# Patient Record
Sex: Female | Born: 1940 | Race: Black or African American | Hispanic: No | Smoking: Never smoker
Health system: Southern US, Community
[De-identification: ages and names within clinical notes are randomized; demographics above are authoritative.]

## PROBLEM LIST (undated history)

## (undated) DIAGNOSIS — M1990 Unspecified osteoarthritis, unspecified site: Secondary | ICD-10-CM

## (undated) DIAGNOSIS — I499 Cardiac arrhythmia, unspecified: Secondary | ICD-10-CM

## (undated) DIAGNOSIS — M7550 Bursitis of unspecified shoulder: Secondary | ICD-10-CM

## (undated) DIAGNOSIS — I10 Essential (primary) hypertension: Secondary | ICD-10-CM

## (undated) DIAGNOSIS — E7800 Pure hypercholesterolemia, unspecified: Secondary | ICD-10-CM

## (undated) DIAGNOSIS — I8390 Asymptomatic varicose veins of unspecified lower extremity: Secondary | ICD-10-CM

## (undated) DIAGNOSIS — H269 Unspecified cataract: Secondary | ICD-10-CM

## (undated) DIAGNOSIS — K2270 Barrett's esophagus without dysplasia: Secondary | ICD-10-CM

## (undated) DIAGNOSIS — Z9889 Other specified postprocedural states: Secondary | ICD-10-CM

## (undated) DIAGNOSIS — K219 Gastro-esophageal reflux disease without esophagitis: Secondary | ICD-10-CM

## (undated) HISTORY — PX: CHOLECYSTECTOMY: SHX55

## (undated) HISTORY — PX: COLONOSCOPY: SHX174

## (undated) HISTORY — PX: KNEE ARTHROSCOPY: SHX127

## (undated) HISTORY — PX: UPPER GI ENDOSCOPY: SHX6162

## (undated) HISTORY — PX: OTHER SURGICAL HISTORY: SHX169

## (undated) HISTORY — PX: ABDOMINAL HYSTERECTOMY: SHX81

## (undated) HISTORY — PX: FRACTURE SURGERY: SHX138

## (undated) HISTORY — PX: TONSILLECTOMY: SUR1361

---

## 2000-05-08 ENCOUNTER — Other Ambulatory Visit: Admission: RE | Admit: 2000-05-08 | Discharge: 2000-05-08 | Payer: Self-pay | Admitting: Gynecology

## 2000-05-11 ENCOUNTER — Encounter: Payer: Self-pay | Admitting: Gynecology

## 2000-05-11 ENCOUNTER — Encounter: Admission: RE | Admit: 2000-05-11 | Discharge: 2000-05-11 | Payer: Self-pay | Admitting: Gynecology

## 2000-11-25 ENCOUNTER — Encounter: Payer: Self-pay | Admitting: *Deleted

## 2000-11-25 ENCOUNTER — Encounter: Payer: Self-pay | Admitting: Orthopedic Surgery

## 2000-11-25 ENCOUNTER — Emergency Department (HOSPITAL_COMMUNITY): Admission: EM | Admit: 2000-11-25 | Discharge: 2000-11-25 | Payer: Self-pay | Admitting: *Deleted

## 2001-06-18 ENCOUNTER — Encounter: Payer: Self-pay | Admitting: Gynecology

## 2001-06-18 ENCOUNTER — Encounter: Admission: RE | Admit: 2001-06-18 | Discharge: 2001-06-18 | Payer: Self-pay | Admitting: Gynecology

## 2001-11-04 ENCOUNTER — Ambulatory Visit (HOSPITAL_BASED_OUTPATIENT_CLINIC_OR_DEPARTMENT_OTHER): Admission: RE | Admit: 2001-11-04 | Discharge: 2001-11-04 | Payer: Self-pay | Admitting: Orthopedic Surgery

## 2002-02-17 ENCOUNTER — Encounter: Admission: RE | Admit: 2002-02-17 | Discharge: 2002-02-17 | Payer: Self-pay | Admitting: Orthopedic Surgery

## 2002-02-17 ENCOUNTER — Encounter: Payer: Self-pay | Admitting: Orthopedic Surgery

## 2002-03-07 ENCOUNTER — Encounter: Payer: Self-pay | Admitting: Orthopedic Surgery

## 2002-03-07 ENCOUNTER — Encounter: Admission: RE | Admit: 2002-03-07 | Discharge: 2002-03-07 | Payer: Self-pay | Admitting: Orthopedic Surgery

## 2002-03-21 ENCOUNTER — Encounter: Admission: RE | Admit: 2002-03-21 | Discharge: 2002-03-21 | Payer: Self-pay | Admitting: Orthopedic Surgery

## 2002-03-21 ENCOUNTER — Encounter: Payer: Self-pay | Admitting: Orthopedic Surgery

## 2002-04-03 ENCOUNTER — Encounter: Payer: Self-pay | Admitting: Orthopedic Surgery

## 2002-04-03 ENCOUNTER — Encounter: Admission: RE | Admit: 2002-04-03 | Discharge: 2002-04-03 | Payer: Self-pay | Admitting: Orthopedic Surgery

## 2002-07-02 ENCOUNTER — Encounter: Admission: RE | Admit: 2002-07-02 | Discharge: 2002-07-02 | Payer: Self-pay | Admitting: Internal Medicine

## 2002-09-08 ENCOUNTER — Encounter (INDEPENDENT_AMBULATORY_CARE_PROVIDER_SITE_OTHER): Payer: Self-pay | Admitting: Specialist

## 2002-09-08 ENCOUNTER — Ambulatory Visit (HOSPITAL_COMMUNITY): Admission: RE | Admit: 2002-09-08 | Discharge: 2002-09-08 | Payer: Self-pay | Admitting: *Deleted

## 2002-10-28 ENCOUNTER — Other Ambulatory Visit: Admission: RE | Admit: 2002-10-28 | Discharge: 2002-10-28 | Payer: Self-pay | Admitting: Gynecology

## 2003-07-06 ENCOUNTER — Encounter: Admission: RE | Admit: 2003-07-06 | Discharge: 2003-07-06 | Payer: Self-pay | Admitting: Internal Medicine

## 2004-07-20 ENCOUNTER — Encounter: Admission: RE | Admit: 2004-07-20 | Discharge: 2004-07-20 | Payer: Self-pay | Admitting: Internal Medicine

## 2005-07-28 ENCOUNTER — Encounter: Admission: RE | Admit: 2005-07-28 | Discharge: 2005-07-28 | Payer: Self-pay | Admitting: Internal Medicine

## 2005-10-30 ENCOUNTER — Encounter (INDEPENDENT_AMBULATORY_CARE_PROVIDER_SITE_OTHER): Payer: Self-pay | Admitting: Specialist

## 2005-10-30 ENCOUNTER — Ambulatory Visit (HOSPITAL_COMMUNITY): Admission: RE | Admit: 2005-10-30 | Discharge: 2005-10-30 | Payer: Self-pay | Admitting: *Deleted

## 2006-08-03 ENCOUNTER — Encounter: Admission: RE | Admit: 2006-08-03 | Discharge: 2006-08-03 | Payer: Self-pay | Admitting: Internal Medicine

## 2007-01-04 ENCOUNTER — Encounter (INDEPENDENT_AMBULATORY_CARE_PROVIDER_SITE_OTHER): Payer: Self-pay | Admitting: *Deleted

## 2007-01-04 ENCOUNTER — Ambulatory Visit (HOSPITAL_COMMUNITY): Admission: RE | Admit: 2007-01-04 | Discharge: 2007-01-04 | Payer: Self-pay | Admitting: *Deleted

## 2007-08-07 ENCOUNTER — Encounter: Admission: RE | Admit: 2007-08-07 | Discharge: 2007-08-07 | Payer: Self-pay | Admitting: Internal Medicine

## 2008-08-24 ENCOUNTER — Encounter: Admission: RE | Admit: 2008-08-24 | Discharge: 2008-08-24 | Payer: Self-pay | Admitting: Internal Medicine

## 2008-10-03 ENCOUNTER — Emergency Department (HOSPITAL_BASED_OUTPATIENT_CLINIC_OR_DEPARTMENT_OTHER): Admission: EM | Admit: 2008-10-03 | Discharge: 2008-10-03 | Payer: Self-pay | Admitting: Emergency Medicine

## 2008-10-03 ENCOUNTER — Ambulatory Visit: Payer: Self-pay | Admitting: Interventional Radiology

## 2009-07-08 ENCOUNTER — Encounter: Admission: RE | Admit: 2009-07-08 | Discharge: 2009-07-08 | Payer: Self-pay | Admitting: Internal Medicine

## 2009-08-26 ENCOUNTER — Encounter: Admission: RE | Admit: 2009-08-26 | Discharge: 2009-08-26 | Payer: Self-pay | Admitting: Internal Medicine

## 2010-04-15 ENCOUNTER — Encounter: Admission: RE | Admit: 2010-04-15 | Discharge: 2010-04-15 | Payer: Self-pay | Admitting: Internal Medicine

## 2010-08-28 ENCOUNTER — Encounter: Payer: Self-pay | Admitting: Internal Medicine

## 2010-09-01 ENCOUNTER — Encounter
Admission: RE | Admit: 2010-09-01 | Discharge: 2010-09-01 | Payer: Self-pay | Source: Home / Self Care | Attending: Internal Medicine | Admitting: Internal Medicine

## 2010-12-20 NOTE — Op Note (Signed)
NAMEAMA, MCMASTER NO.:  [id number]   MEDICAL RECORD NO.:  [id number]          PATIENT TYPE:  AMB   LOCATION:  ENDO                         FACILITY:  [facility]   PHYSICIAN:  Georgiana Spinner, M.D.    DATE OF BIRTH:  06-27-1941   DATE OF PROCEDURE:  01/04/2007  DATE OF DISCHARGE:                               OPERATIVE REPORT   PROCEDURE:  Upper endoscopy.   INDICATIONS:  Gastroesophageal reflux disease.  Rule out Barrett  esophagus.   ANESTHESIA:  Fentanyl 75 mcg, Versed 5 mg.   PROCEDURE:  With the patient mildly sedated in the left lateral  decubitus position, the Pentax videoscopic endoscope was passed under  direct vision through the esophagus and I could not get the  squamocolumnar junction to open fully but I elected to biopsy areas that  could be potential bleed Barrett's.  After photographing the area we  entered into the stomach, fundus, body, antrum, duodenal bulb, second  portion of the duodenum were visualized.  From this point the endoscope  was slowly withdrawn, taking circumferential views of duodenal mucosa  until the endoscope which had been pulled back into the stomach and  placed in retroflexion to view the stomach from below.  The endoscope  was straightened and withdrawn, taking circumferential views of  remaining gastric and esophageal mucosa stopping in the body of the  stomach where a small, benign-appearing polyp was seen, photographed and  biopsied.  The patient's vital signs, pulse oximeter remained stable.  The patient tolerated the procedure well without apparent complications.   FINDINGS:  Polyp in the body of the stomach and a question of Barrett  esophagus, both biopsied.  Await biopsy reports.  The patient will call  me for results and followup with me as an outpatient.           ______________________________  Georgiana Spinner, M.D.     GMO/MEDQ  D:  01/04/2007  T:  01/04/2007  Job:  [id number]

## 2010-12-23 NOTE — Op Note (Signed)
NAMELEONETTE, TISCHER NO.:  [id number]   MEDICAL RECORD NO.:  [id number]          PATIENT TYPE:  AMB   LOCATION:  ENDO                         FACILITY:  [facility]   PHYSICIAN:  Georgiana Spinner, M.D.    DATE OF BIRTH:  [birth year]   DATE OF PROCEDURE:  10/30/2005  DATE OF DISCHARGE:                                 OPERATIVE REPORT   PROCEDURE:  Colonoscopy.   INDICATIONS:  Colon polyps.   ANESTHESIA:  Demerol 10, Versed 2 mg.   PROCEDURE:  The patient mildly sedated in the left lateral decubitus  position, the Olympus videoscopic colonoscope was inserted into the rectum  and passed under direct vision to the cecum identified by base of cecum and  ileocecal valve, both of which were photographed.  From this point the  colonoscope was slowly withdrawn taking circumferential views of colonic  mucosa stopping only in the rectum which appeared normal on direct and  showed hemorrhoids on retroflexed view.  The endoscope was straightened and  withdrawn.  The patient's vital signs, pulse oximeter remained stable.  The  patient tolerated procedure well without complications.   FINDINGS:  Internal hemorrhoids otherwise unremarkable colonoscopic  examination to the cecum.   PLAN:  Consider repeat examination possibly in 5 years           ______________________________  Georgiana Spinner, M.D.     GMO/MEDQ  D:  10/30/2005  T:  10/31/2005  Job:  [id number]

## 2010-12-23 NOTE — Op Note (Signed)
Nancy Sosa, LADLEY NO.:  [id number]   MEDICAL RECORD NO.:  [id number]          PATIENT TYPE:  AMB   LOCATION:  ENDO                         FACILITY:  [facility]   PHYSICIAN:  Georgiana Spinner, M.D.    DATE OF BIRTH:  [birth year]   DATE OF PROCEDURE:  10/30/2005  DATE OF DISCHARGE:                                 OPERATIVE REPORT   PROCEDURE:  Upper endoscopy.   INDICATIONS:  Barrett's esophagus, GERD.   ANESTHESIA:  Demerol 70 mg, Versed 5 mg.   PROCEDURE:  The patient mildly sedated in the left lateral decubitus  position the Olympus videoscopic endoscope was inserted in the mouth and  passed under direct vision through the esophagus which appeared normal until  we reached distal esophagus and there appeared to be an area of Barrett's  photographed and biopsied.  We entered into the stomach. Fundus appeared  normal, body showed a polyp on the posterior wall.  It was approximately 7  mm in size, it was smooth and benign appearing and was biopsied.  We then  advanced through the pylorus into duodenal bulb, second portion duodenum  both of which appeared normal.  From this point the endoscope was slowly  withdrawn taking circumferential views of duodenal mucosa until the  endoscope had been pulled back into the stomach placed in retroflexion to  view the stomach from below.  The endoscope was straightened and withdrawn  taking circumferential views remaining gastric and esophageal mucosa.  The  patient's vital signs, pulse oximeter remained stable.  The patient  tolerated procedure well without apparent complications.   FINDINGS:  Changes of Barrett's esophagus, polyp of the body of the stomach.  Await biopsy reports.  The patient will call me for results and follow-up  with me as an outpatient.  Proceed to colonoscopy as planned.           ______________________________  Georgiana Spinner, M.D.     GMO/MEDQ  D:  10/30/2005  T:  10/31/2005  Job:  [id number]

## 2010-12-23 NOTE — Op Note (Signed)
   NAME:  Nancy Sosa, Nancy Sosa                           ACCOUNT NO.:  [id number]   MEDICAL RECORD NO.:  [id number]                   PATIENT TYPE:  AMB   LOCATION:  ENDO                                 FACILITY:  [facility]   PHYSICIAN:  Georgiana Spinner, M.D.                 DATE OF BIRTH:  08-Oct-1940   DATE OF PROCEDURE:  09/08/2002  DATE OF DISCHARGE:                                 OPERATIVE REPORT   PROCEDURE PERFORMED:  Colonoscopy with polypectomy.   ENDOSCOPIST:  Georgiana Spinner, M.D.   INDICATIONS FOR PROCEDURE:  Colon polyps, rectal bleeding.   ANESTHESIA:  Demerol 10 mg, Versed 1 mg.   DESCRIPTION OF PROCEDURE:  With the patient mildly sedated in the left  lateral decubitus position, the Olympus video colonoscope was inserted in  the rectum and passed easily under direct vision to the cecum, identified by  the ileocecal valve and appendiceal orifice, both of which were  photographed.  From this point the colonoscope was slowly withdrawn taking  circumferential views of the entire colonic mucosa stopping in the sigmoid  colon at approximately 30 cm from the anal verge at which point a small  polyp was seen, photographed and removed using snare cautery technique at a  setting of 20/20 blended current.  The endoscope was then withdrawn to the  rectum which appeared normal on direct and showed hemorrhoids on retroflex  view.  The endoscope was straightened and withdrawn.  The patient's vital  signs and pulse oximeter remained stable.  The patient tolerated the  procedure well without apparent complications.   FINDINGS:  Diverticulosis of the sigmoid colon, small polyp at 30 cm from  the anal verge, await biopsy report.  Patient will call me for results and  follow up with me as an outpatient.   PLAN:                                                 Georgiana Spinner, M.D.    GMO/MEDQ  D:  09/08/2002  T:  09/08/2002  Job:  [id number]

## 2010-12-23 NOTE — Op Note (Signed)
   NAME:  Nancy Sosa, Nancy Sosa                           ACCOUNT NO.:  [id number]   MEDICAL RECORD NO.:  [id number]                   PATIENT TYPE:  AMB   LOCATION:  ENDO                                 [facility]ACILITY[facility]  [facility]   PHYSICIAN:  Georgiana Spinner, M.D.                 DATE OF BIRTH:  [birth year]   DATE OF PROCEDURE:  09/08/2002  DATE OF DISCHARGE:                                 OPERATIVE REPORT   PREOPERATIVE DIAGNOSIS:  Upper endoscopy.   INDICATIONS:  GI bleed.   ANESTHESIA:  Demerol 50 mg, Versed 6 mg.   DESCRIPTION OF PROCEDURE:  With the patient mildly sedated in the left  lateral decubitus position, the Olympus videoscopic endoscope was inserted  in the mouth, passed under direct vision through the esophagus, which  appeared normal; however, there was a questionable area of Barrett's  photographed, and we attempted to biopsy this area.  The fundus, body,  antrum, duodenal bulb, and second portion of the duodenum appeared normal.  From this point the endoscope was slowly withdrawn, taking circumferential  views of the entire duodenal mucosa until the endoscope had been pulled back  into the stomach, placed in retroflexion to view the stomach from below.  The endoscope was straightened and withdrawn, taking circumferential views  of the remaining gastric and esophageal mucosa.  The patient's vital signs  and pulse oximetry remained stable.  The patient tolerated the procedure  well without apparent complications.   FINDINGS:  Question of short-segment Barrett's esophagus, biopsied.   PLAN:  Await biopsy results.  The patient will follow up with me as an  outpatient and call me for the results of biopsy.                                               Georgiana Spinner, M.D.    GMO/MEDQ  D:  09/08/2002  T:  09/08/2002  Job:  [id number]

## 2011-05-31 ENCOUNTER — Other Ambulatory Visit (HOSPITAL_COMMUNITY): Payer: Self-pay | Admitting: Orthopedic Surgery

## 2011-05-31 ENCOUNTER — Other Ambulatory Visit: Payer: Self-pay | Admitting: Orthopedic Surgery

## 2011-05-31 ENCOUNTER — Encounter (HOSPITAL_COMMUNITY): Payer: Medicare Other

## 2011-05-31 ENCOUNTER — Ambulatory Visit (HOSPITAL_COMMUNITY)
Admission: RE | Admit: 2011-05-31 | Discharge: 2011-05-31 | Disposition: A | Discharge status: Home / Self Care | Payer: Medicare Other | Source: Ambulatory Visit | Attending: Orthopedic Surgery | Admitting: Orthopedic Surgery

## 2011-05-31 DIAGNOSIS — Z01818 Encounter for other preprocedural examination: Secondary | ICD-10-CM | POA: Insufficient documentation

## 2011-05-31 DIAGNOSIS — M1711 Unilateral primary osteoarthritis, right knee: Secondary | ICD-10-CM

## 2011-05-31 DIAGNOSIS — I10 Essential (primary) hypertension: Secondary | ICD-10-CM | POA: Insufficient documentation

## 2011-05-31 DIAGNOSIS — Z8709 Personal history of other diseases of the respiratory system: Secondary | ICD-10-CM | POA: Insufficient documentation

## 2011-05-31 DIAGNOSIS — Z01812 Encounter for preprocedural laboratory examination: Secondary | ICD-10-CM | POA: Insufficient documentation

## 2011-05-31 DIAGNOSIS — M1710 Unilateral primary osteoarthritis, unspecified knee: Secondary | ICD-10-CM | POA: Insufficient documentation

## 2011-05-31 DIAGNOSIS — Z01811 Encounter for preprocedural respiratory examination: Secondary | ICD-10-CM | POA: Insufficient documentation

## 2011-05-31 LAB — URINALYSIS, ROUTINE W REFLEX MICROSCOPIC
Urobilinogen, UA: 0.2 mg/dL (ref 0.0–1.0)
pH: 6 (ref 5.0–8.0)

## 2011-05-31 LAB — CBC
MCH: 30.1 pg (ref 26.0–34.0)
MCV: 90.4 fL (ref 78.0–100.0)

## 2011-05-31 LAB — APTT: aPTT: 33 seconds (ref 24–37)

## 2011-05-31 LAB — URINE MICROSCOPIC-ADD ON

## 2011-06-05 ENCOUNTER — Inpatient Hospital Stay (HOSPITAL_COMMUNITY)
Admission: RE | Admit: 2011-06-05 | Discharge: 2011-06-08 | DRG: 470 | Disposition: A | Discharge status: Skilled Nursing Facility | Payer: Medicare Other | Source: Ambulatory Visit | Attending: Orthopedic Surgery | Admitting: Orthopedic Surgery

## 2011-06-05 DIAGNOSIS — H547 Unspecified visual loss: Secondary | ICD-10-CM | POA: Diagnosis present

## 2011-06-05 DIAGNOSIS — E7800 Pure hypercholesterolemia, unspecified: Secondary | ICD-10-CM | POA: Diagnosis present

## 2011-06-05 DIAGNOSIS — R000 Tachycardia, unspecified: Secondary | ICD-10-CM | POA: Diagnosis present

## 2011-06-05 DIAGNOSIS — K219 Gastro-esophageal reflux disease without esophagitis: Secondary | ICD-10-CM | POA: Diagnosis present

## 2011-06-05 DIAGNOSIS — E876 Hypokalemia: Secondary | ICD-10-CM | POA: Diagnosis not present

## 2011-06-05 DIAGNOSIS — I10 Essential (primary) hypertension: Secondary | ICD-10-CM | POA: Diagnosis present

## 2011-06-05 DIAGNOSIS — H269 Unspecified cataract: Secondary | ICD-10-CM | POA: Diagnosis present

## 2011-06-05 DIAGNOSIS — Z01812 Encounter for preprocedural laboratory examination: Secondary | ICD-10-CM

## 2011-06-05 DIAGNOSIS — J309 Allergic rhinitis, unspecified: Secondary | ICD-10-CM | POA: Diagnosis present

## 2011-06-05 DIAGNOSIS — M1710 Unilateral primary osteoarthritis, unspecified knee: Principal | ICD-10-CM | POA: Diagnosis present

## 2011-06-05 DIAGNOSIS — Z880 Allergy status to penicillin: Secondary | ICD-10-CM

## 2011-06-05 DIAGNOSIS — I8390 Asymptomatic varicose veins of unspecified lower extremity: Secondary | ICD-10-CM | POA: Diagnosis present

## 2011-06-05 DIAGNOSIS — Z79899 Other long term (current) drug therapy: Secondary | ICD-10-CM

## 2011-06-05 DIAGNOSIS — J45909 Unspecified asthma, uncomplicated: Secondary | ICD-10-CM | POA: Diagnosis present

## 2011-06-05 DIAGNOSIS — K2270 Barrett's esophagus without dysplasia: Secondary | ICD-10-CM | POA: Diagnosis present

## 2011-06-05 DIAGNOSIS — E871 Hypo-osmolality and hyponatremia: Secondary | ICD-10-CM | POA: Diagnosis not present

## 2011-06-05 HISTORY — PX: JOINT REPLACEMENT: SHX530

## 2011-06-05 LAB — TYPE AND SCREEN: Antibody Screen: NEGATIVE

## 2011-06-05 LAB — ABO/RH: ABO/RH(D): A POS

## 2011-06-06 LAB — CBC
HCT: 36.5 % (ref 36.0–46.0)
Hemoglobin: 12.2 g/dL (ref 12.0–15.0)
MCV: 89 fL (ref 78.0–100.0)
Platelets: 240 10*3/uL (ref 150–400)
RBC: 4.1 MIL/uL (ref 3.87–5.11)
RDW: 13.1 % (ref 11.5–15.5)

## 2011-06-06 LAB — BASIC METABOLIC PANEL
BUN: 17 mg/dL (ref 6–23)
Calcium: 9.5 mg/dL (ref 8.4–10.5)
Chloride: 96 mEq/L (ref 96–112)
Creatinine, Ser: 0.71 mg/dL (ref 0.50–1.10)
GFR calc non Af Amer: 85 mL/min — ABNORMAL LOW (ref >90)
Glucose, Bld: 121 mg/dL — ABNORMAL HIGH (ref 70–99)
Potassium: 3.8 mEq/L (ref 3.5–5.1)
Sodium: 132 mEq/L — ABNORMAL LOW (ref 135–145)

## 2011-06-07 LAB — BASIC METABOLIC PANEL
Calcium: 9.9 mg/dL (ref 8.4–10.5)
Glucose, Bld: 118 mg/dL — ABNORMAL HIGH (ref 70–99)
Potassium: 3.3 mEq/L — ABNORMAL LOW (ref 3.5–5.1)
Sodium: 136 mEq/L (ref 135–145)

## 2011-06-07 LAB — CBC
HCT: 32.7 % — ABNORMAL LOW (ref 36.0–46.0)
Hemoglobin: 10.9 g/dL — ABNORMAL LOW (ref 12.0–15.0)
MCH: 29.7 pg (ref 26.0–34.0)
MCV: 89.1 fL (ref 78.0–100.0)
RBC: 3.67 MIL/uL — ABNORMAL LOW (ref 3.87–5.11)
RDW: 12.9 % (ref 11.5–15.5)

## 2011-06-08 LAB — CBC: Platelets: 212 10*3/uL (ref 150–400)

## 2011-06-09 NOTE — H&P (Signed)
Nancy Sosa, Nancy Sosa                 ACCOUNT NO.:  [id number]  MEDICAL RECORD NO.:  [id number]  LOCATION:                                 FACILITY:  PHYSICIAN:  Ollen Gross, M.D.    DATE OF BIRTH:  [birth year]  DATE OF ADMISSION:  06/05/2011 DATE OF DISCHARGE:                             HISTORY & PHYSICAL   CHIEF COMPLAINT:  Right knee pain.  HISTORY OF PRESENT ILLNESS:  The patient is a 70 year old female who has been seen by Dr. Despina Hick for ongoing bilateral knee pain.  The right is more symptomatic and problematic than the left.  It has been going on for over 10 years now.  Over the past 5 years though, she has been getting progressively worse.  She has had an arthroscopic debridement. She also has undergone cortisone shots and viscous supplementation shots.  Despite conservative measures including therapy, cortisone shots and gel shots, she continues to have continued pain.  She has noted already to have end-stage arthritis of the right knee with advanced arthritis of the left knee.  It is felt she would benefit from undergoing surgical intervention.  Risks and benefits have been discussed.  She elected to proceed with surgery.  No obvious contraindications to surgery such as ongoing infection or progressive neurological disease.  ALLERGIES: 1. STATIN DRUGS. 2. PENICILLIN. 3. SEPTRA.  CURRENT MEDICATIONS:  Allegra, Benicar HCTZ, Estroven, Zetia, tramadol, calcium plus D, multivitamin, meloxicam, magnesium, metoprolol and Nexium.  PAST MEDICAL HISTORY: 1. Occasional headaches. 2. Impaired vision, wears glasses. 3. Seasonal allergies. 4. History of asthma. 5. History of bronchitis. 6. Hypertension. 7. Reflux disease. 8. Barrett esophagitis. 9. Hypercholesterolemia. 10.History of heart arrhythmia/tachycardia. 11.Early cataracts. 12.Varicose veins.  PAST SURGICAL HISTORY: 1. Tonsillectomy. 2. Hysterectomy. 3. 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 4. Arthroscopic knee surgery bilaterally. 5. Left foot bunion. 6. Gallbladder surgery.  FAMILY HISTORY:  Noncontributory.  SOCIAL HISTORY:  Widowed.  Retired.  Nonsmoker, no alcohol.  Lives alone.  She does want to look into a skilled facility, such as [facility].  REVIEW OF SYSTEMS:  GENERAL:  No fevers or chills.  Occasional night sweats.  NEUROLOGIC:  Occasional headaches.  No seizure, syncope or paralysis.  RESPIRATORY:  A little bit of shortness of breath on exertion, but no shortness of breath, productive cough or hemoptysis. CARDIOVASCULAR:  No chest pain, angina or orthopnea.  She does have a history of tachycardia.  GI:  No nausea, vomiting, diarrhea or constipation.  Occasional heartburn.  GU:  No dysuria or hematuria. Does have a little bit of frequency and nocturia.  MUSCULOSKELETAL: Knee pain.  PHYSICAL EXAMINATION:  VITAL SIGNS:  Pulse 68, respirations 14, blood pressure 132/82. GENERAL:  This is a 70 year old African American female, well nourished, well developed, overweight, in no acute distress.  She is alert, oriented, cooperative and pleasant.  HEENT:  Normocephalic and atraumatic.  Pupils are round, reactive.  EOMs intact. NECK:  Supple.  No carotid bruits. CHEST:  Clear anterior posterior chest walls.  No rhonchi, rales or wheezing. HEART:  Regular rate and rhythm.  No murmur.  S1, S2 noted. ABDOMEN:  Soft, round, protuberant abdomen.  Bowel sounds present. RECTAL:  Not done, not pertinent to present illness. BREASTS:  Not done, not pertinent to present illness. GENITALIA:  Not done, not pertinent to present illness. EXTREMITIES:  Right knee varus malalignment deformity, crepitus, range of motion 5-95.  IMPRESSION:  Osteoarthritis, right knee.  PLAN:  The patient admitted to [facility] to undergo a right total knee replacement arthroplasty.  Surgery will be performed by Dr. Trudee Grip.  She does want to look into [facility] for  inpatient rehab following her hospital stay.     Alexzandrew L. Julien Girt, P.A.C.   ______________________________ Ollen Gross, M.D.   ALP/MEDQ  D:  06/04/2011  T:  06/04/2011  Job:  [id number]  cc:   Juline Patch, M.D. Fax: [phone]  Electronically Signed by Patrica Duel P.A.C. on 06/08/2011 10:30:55 AM Electronically Signed by Ollen Gross M.D. on 06/09/2011 09:50:21 AM

## 2011-06-09 NOTE — Discharge Summary (Signed)
NAMEHARVEY, Nancy Sosa NO.:  [id number]  MEDICAL RECORD NO.:  [id number]  LOCATION:  1610                         FACILITY:  [facility]  PHYSICIAN:  Ollen Gross, M.D.    DATE OF BIRTH:  1941-06-18  DATE OF ADMISSION:  06/05/2011 DATE OF DISCHARGE:  06/08/2011                        DISCHARGE SUMMARY - REFERRING   ADMITTING DIAGNOSES: 1. Osteoarthritis, right knee. 2. Occasional headaches. 3. Impaired vision. 4. Seasonal allergies. 5. History of asthma. 6. History of bronchitis. 7. Hypertension. 8. Reflux disease. 9. Barrett esophagitis. 10.Hypercholesterolemia. 11.History of heart arrhythmia/tachycardia. 12.Early cataracts. 13.Varicose veins.  DISCHARGE DIAGNOSES: 1. Osteoarthritis of right knee, status post right total knee     replacement arthroplasty. 2. Postop hyponatremia. 3. Mild postop hypokalemia. 4. Occasional headaches. 5. Impaired vision. 6. Seasonal allergies. 7. History of asthma. 8. History of bronchitis. 9. Hypertension. 10.Reflux disease. 11.Barrett esophagitis. 12.Hypercholesterolemia. 13.History of heart arrhythmia/tachycardia. 14.Early cataracts. 15.Varicose veins.  PROCEDURE:  On June 05, 2011, right total knee arthroplasty. Surgeon:  Ollen Gross, M.D.  Assistant:  Alexzandrew L. Perkins, P.A.C.  Anesthesia:  General.  Tourniquet time: 37 minutes.  CONSULTS:  None.  BRIEF HISTORY:  The patient is a 70 year old female with advanced arthritis of the right knee, progressive worsening pain, increasing dysfunction.  She already has severe bone-on-bone changes tricompartmentally and also 20-degree flexion contracture.  It was felt she would benefit undergoing surgical intervention.  Risks and has been discussed, she elected to surgery.  LABORATORY DATA:  CBC on admission:  Hemoglobin 13.8, hematocrit 41.4, white cell count 5.5, platelets 230, PT/INR 13.7 and 1.03 with a PTT of 30.  Urinalysis preop, cloudy.  Trace blood,  moderate leukocytes, many squamous, 7-10 white cells, 0-2 red cells, few yeast.  Chem panel was taken over to medical physicians' office on May 24, 2011, slightly elevated BUN at 27.  Remaining chem panel within normal limits.  She also had a lipid profile taken preoperatively.  Cholesterol elevated to 261, triglycerides 110, HDL 55, LDL slightly elevated at 184, VLDL 22. Thyroid level of 1.880 and a creatine kinase elevated at 597.  Serial CBCs were followed.  Hemoglobin dropped down to 12.2, then 10.9, came back up.  Last H and H 11.2 and 33.3.  Serial BMETs were followed. Sodium dropped down to 132, back up to 136; potassium dropped to 3.8, last noted 3.3.  Blood group type A positive.  Nasal swabs were negative.  Staph aureus negative for MRSA.  X-RAYS:  May 31, 2011, two-view chest, stable exam, no active cardiopulmonary process.  EKG dated March 23, 2011, sinus bradycardia, poor R-wave progression, probable normal variant, inferior T-wave changes were nonspecific. Confirmed unable to read signature.  HOSPITAL COURSE:  The patient admitted to [facility], taken to OR, underwent above-stated procedure without complication.  Due to her history, she was again noted to be a little tachycardic after surgery. She was transferred to the telemetry floor for monitoring overnight. She did well through the night.  She was placed back on her home meds including her beta-blocker, metoprolol.  Her pulse rate in the next morning was in the 80.  She had a rough night with pain, but doing better on  the morning of day 1, therefore she was transferred up to the orthopedic floor for continued care.  Allowed to be weightbearing as tolerated, getting up out of bed with PT and also undergone OT.  She knew she want to look into a skilled facility, so we got social worker involved right away.  She wanted look into [facility].  By day 2, she had been weaned off her IV analgesics in the  form of the PCA and weaned over to p.o. meds.  By day 2, she was doing much better.  Pain was under better control.  Dressing changed, incision looked good.  Hemoglobin was 10.9.  At that point, potassium just a little low at 3.3, felt to be a dilutional component.  We are going to allow her to constrict backup. Continued progress with therapy.  By the morning of day 3, she was doing well.  Her incision looked good.  Hemoglobin was stable 11.2, no complaints.  It is felt that as long as a bed was available, she transferred to [facility].  DISCHARGE PLAN: 1. Plan to transfer to [facility] today on June 08, 2011. 2. Discharge diagnoses, please see above. 3. Discharge meds.  CURRENT MEDICATIONS:  At time of transfer include; 1. Xarelto 10 mg p.o. daily for 3 weeks, then discontinue the Xarelto. 2. Colace 100 mg p.o. b.i.d. 3. Metoprolol 100 mg daily at bedtime. 4. Benicar/HCTZ 40/12.5 mg p.o. every morning. 5. Zetia 10 mg p.o. every evening. 6. Nexium 40 mg p.o. at bedtime. 7. Tylenol 325 one or two every 4-6 hours as needed for mild pain,     temperature, or headache. 8. Laxative of choice. 9. Enema of choice. 10.Robaxin 500 mg p.o. q.6-8 hours p.r.n. spasm. 11.Tramadol 50 mg p.o. twice a day as needed for mild-to-moderate     pain. 12.OxyIR 5 mg 1-2 tabs every 4 hours as needed for moderate pain. 13.Claritin 10 mg p.o. q. day p.r.n. seasonal allergies.  DIET:  Heart healthy cardiac diet.  ACTIVITY:  She is weightbearing as tolerated to the right lower extremity.  Continue with PT and OT for gait training, ambulation, ADLs, range of motion strengthening exercises, total knee protocol.  Please note, she may start showering, however, do not submerge incision under water.  FOLLOWUP:  She needs to follow up Dr. Lequita Halt in the office 2 weeks from the date of surgery.  Please contact the office at [phone] to help arrange appointment, follow-up care.  DISPOSITION:  [facility].  CONDITION ON DISCHARGE:  Improving.     Alexzandrew L. Julien Girt, P.A.C.   ______________________________ Ollen Gross, M.D.    ALP/MEDQ  D:  06/08/2011  T:  06/08/2011  Job:  [id number]  cc:   Juline Patch, M.D. Fax: [phone]  Skilled Nursing Facility  Electronically Signed by Patrica Duel P.A.C. on 06/08/2011 11:51:27 AM Electronically Signed by Ollen Gross M.D. on 06/09/2011 09:50:23 AM

## 2011-06-09 NOTE — Op Note (Signed)
NAMEARIELIZ, LATINO NO.:  [id number]  MEDICAL RECORD NO.:  [id number]  LOCATION:  0005                         FACILITY:  [facility]  PHYSICIAN:  Ollen Gross, M.D.    DATE OF BIRTH:  1941-01-13  DATE OF PROCEDURE:  06/05/2011 DATE OF DISCHARGE:                              OPERATIVE REPORT   PREOPERATIVE DIAGNOSIS:  Osteoarthritis, right knee.  POSTOPERATIVE DIAGNOSIS:  Osteoarthritis, right knee.  PROCEDURE:  Right total knee arthroplasty.  SURGEON:  Ollen Gross, M.D.  ASSISTANT:  Alexzandrew L. Perkins, P.A.C.  ANESTHESIA:  General.  ESTIMATED BLOOD LOSS:  Minimal.  DRAINS:  Hemovac x1.  TOURNIQUET TIME:  37 minutes at 300 mmHg.  COMPLICATIONS:  None.  CONDITION:  Stable to recovery.  BRIEF CLINICAL NOTE:  Nancy Sosa is a 70 year old female with advanced end-stage arthritis of the right knee with progressively worsening pain and dysfunction.  She has severe bone-on-bone changes tricompartmental in nature with a 20 degree flexion contracture.  She has pain with activity as well as at rest.  She cannot sleep at night.  She had difficulty with all ADLs.  She cannot exercise and physical therapy is not an option given her pain and deformity.  She has had analgesics without benefit.  She has had injections of cortisone without benefit. At this point given her severe end-stage arthritis with severe dysfunction, it is felt that total knee arthroplasty is the most reasonable means of improving her pain and dysfunction.  Of note, on her x-rays, she has tricompartmental bone-on-bone with severe subchondral cystic formation, tibial subluxation, and marginal osteophytes.  PROCEDURE IN DETAIL:  After successful administration of general anesthetic, a tourniquet was placed high on the right thigh and right lower extremity was prepped and draped in the usual sterile fashion. Extremity was wrapped in Esmarch, knee flexed, tourniquet inflated to 300 mmHg.   Midline incision was made with a 10 blade through subcutaneous tissue to the level of the extensor mechanism.  A fresh blade was used to make a medial parapatellar arthrotomy.  Soft tissue on the proximal medial tibia subperiosteally elevated to the joint line with the knife into the semimembranosus bursa with a Cobb elevator. Soft tissue laterally was elevated with attention being paid to avoiding the patellar tendon on tibial tubercle.  Patella was everted, knee flexed 90 degrees, and ACL and PCL removed.  Drill was used to create a starting hole in the distal femur and the canal was thoroughly irrigated.  Note that she had the exposed bone medial, lateral, and in the patellofemoral joint.  Once the canal was irrigated, then the 5 degree right valgus alignment guide was placed into the femoral canal. The distal femoral cutting block was pinned to remove 11 mm off the distal femur.  Distal femoral resection was made with an oscillating saw.  There was a large cyst on the medial femoral condyle.  I curetted this out down to a stable healthy base.  It was about a 1 x 1 cm cyst.  The tibia was subluxed forward and the menisci was removed. Extramedullary tibial alignment guide was placed, referencing proximally at the medial aspect of tibial tubercle and distally along  the second metatarsal axis and tibial crest.  The block was pinned to remove 2 mm off the more deficient medial side.  Tibial resection was made with an oscillating saw.  Sizing block was placed and size 3 was most appropriate.  The proximal tibia was then prepared the modular drill and keel punch for the size 3.  Femoral sizing guide was placed, size 3 was most appropriate.  Rotation was marked off the epicondylar axis and confirmed by creating rectangular flexion gap at 90 degrees.  The block was pinned in that rotation and the anterior, posterior, and chamfer cut were made. Intercondylar block was placed and that cut was  made.  The trial size 3 posterior stabilized femur was placed.  A 12.5 mm posterior stabilized rotating platform insert trial was placed with 12.5.  Full extensions achieved with excellent varus-valgus and anterior-posterior balance, throughout full range of motion.  The patella was everted and thickness measured to be 22 mm.  Freehand resection was taken to 12 mm, 35 template was placed, lug holes were drilled, trial patella was placed and it tracks normally.  Osteophyte was removed off the posterior femur with the trial in place.  All trials were removed and the cut bone surfaces were prepared with pulsatile lavage.  Cement was mixed and once ready for implantation, the size 3 mobile bearing tibial tray, size 3 posterior stabilized femur, and 35 patella were cemented into place. The patella was held with a clamp.  Trial 12.5 mm insert was placed, knee held in full extension, all extruded cement removed.  The cement had fully hardened, then the permanent 12.5 mm posterior stabilized rotating platform insert was placed into the tibial tray.  The wound was copiously irrigated with saline solution and the extensor mechanism was closed over a Hemovac drain with interrupted #1 PDS.  Flexion against gravity was 140 degrees.  Patella tracks normally.  Tourniquet was released, total time 37 minutes.  Subcu was closed with interrupted 2-0 Vicryl, subcuticular running 4-0 Monocryl.  The catheter for the Marcaine pain pumps placed and pumps initiated.  Incision was cleaned and dried and Steri-Strips and a bulky sterile dressing were applied. She was then placed into a knee immobilizer, awakened, and transported to recovery in stable condition.      Please note that a surgical assistant was a medical necessity for this procedure in order to perform it in a safe and expeditious manner. Assistant was necessary to retract the ligaments and vital neuovascular structures and to properly position the  limb  to allow for anatomic placement of the prosthesis.     Ollen Gross, M.D.     FA/MEDQ  D:  06/05/2011  T:  06/05/2011  Job:  [id number]  Electronically Signed by Ollen Gross M.D. on 06/09/2011 09:50:17 AM

## 2011-06-22 ENCOUNTER — Other Ambulatory Visit: Payer: Self-pay | Admitting: Orthopedic Surgery

## 2011-06-23 ENCOUNTER — Encounter (HOSPITAL_COMMUNITY): Payer: Self-pay | Admitting: Pharmacy Technician

## 2011-06-23 ENCOUNTER — Encounter (HOSPITAL_COMMUNITY): Payer: Self-pay | Admitting: *Deleted

## 2011-06-25 ENCOUNTER — Other Ambulatory Visit: Payer: Self-pay | Admitting: Orthopedic Surgery

## 2011-06-25 NOTE — H&P (Signed)
NAME: Nancy Sosa, Nancy Sosa ACCOUNT NO.: [id number]  MEDICAL RECORD NO.: [id number]   PHYSICIAN: Frank Aluisio, M.D. DATE OF BIRTH: 05/12/1941   DATE OF ADMISSION: 06/26/2011   HISTORY & PHYSICAL   CHIEF COMPLAINT: Right knee pain.   HISTORY OF PRESENT ILLNESS: The patient is a 69-year-old female who has  been seen by Dr. Alusio for ongoing bilateral knee pain. The right has been  more symptomatic and problematic than the left.  She just underwent a total knee replacement for her arthritis recently back on 06/05/2011.  Unfortunately, while she was in rehab, she sustained a rupture of her patella tendon. She was seen in the office in follow up and it is felt she would benefit from  undergoing surgical intervention. Risks and benefits have been  discussed. She elected to proceed with surgery. No obvious  contraindications to surgery such as ongoing infection or progressive  neurological disease.   ALLERGIES:  1. STATIN DRUGS.  2. PENICILLIN.  3. SEPTRA.  4. BACTRIM  CURRENT MEDICATIONS: acetaminophen (TYLENOL) 325 MG tablet esomeprazole (NEXIUM) 40 MG capsule ezetimibe (ZETIA) 10 MG tablet loratadine (CLARITIN) 10 MG tablet magnesium citrate 1.745 GM/30ML SOLN menthol-cetylpyridinium (CEPACOL) 3 MG lozenge methocarbamol (ROBAXIN) 500 MG tablet metoprolol (LOPRESSOR) 100 MG tablet olmesartan-hydrochlorothiazide (BENICAR HCT) 40-12.5 MG per tablet oxycodone (OXY-IR) 5 MG capsule polyethylene glycol (MIRALAX / GLYCOLAX) packet potassium chloride SA (K-DUR,KLOR-CON) 20 MEQ tablet sennosides-docusate sodium (SENOKOT-S) 8.6-50 MG tablet  traMADol (ULTRAM) 50 MG tablet  zolpidem (AMBIEN) 5 MG tablet  PAST MEDICAL HISTORY:  1. Occasional headaches.  2. Impaired vision, wears glasses.  3. Seasonal allergies.  4. History of asthma.  5. History of bronchitis.  6. Hypertension.  7. Reflux disease.  8. Barrett esophagitis.  9. Hypercholesterolemia.  10.History of heart  arrhythmia/tachycardia.  11.Early cataracts.  12.Varicose veins.   PAST SURGICAL HISTORY:  1. Tonsillectomy.  2. Hysterectomy.  3. Removal of fibroid tumors from right hand and breast.  4. Arthroscopic knee surgery bilaterally.  5. Left foot bunion.  6. Gallbladder surgery.  7. Right Total Knee Replacement 06/05/11  FAMILY HISTORY: Noncontributory.   SOCIAL HISTORY: Widowed. Retired. Nonsmoker, no alcohol. Lives  alone. She went to [facility] recently following her knee replacement.  REVIEW OF SYSTEMS: GENERAL: No fevers or chills. Occasional night  sweats. NEUROLOGIC: Occasional headaches. No seizure, syncope or  paralysis. RESPIRATORY: A little bit of shortness of breath on  exertion, but no shortness of breath, productive cough or hemoptysis.  CARDIOVASCULAR: No chest pain, angina or orthopnea. She does have a  history of tachycardia. GI: No nausea, vomiting, diarrhea or  constipation. Occasional heartburn. GU: No dysuria or hematuria.  Does have a little bit of frequency and nocturia. MUSCULOSKELETAL:  Knee pain.   PHYSICAL EXAMINATION: VITAL SIGNS: Pulse 68, respirations 14, blood  pressure 132/82.   GENERAL: This is a 69-year-old African American female, well nourished,  well developed, overweight, in no acute distress. She is alert,  oriented, cooperative and pleasant.  HEENT: Normocephalic and  atraumatic. Pupils are round, reactive. EOMs intact.  NECK: Supple. No carotid bruits.  CHEST: Clear anterior posterior chest walls. No rhonchi, rales or  wheezing.  HEART: Regular rate and rhythm. No murmur. S1, S2 noted.  ABDOMEN: Soft, round, protuberant abdomen. Bowel sounds present.  RECTAL: Not done, not pertinent to present illness.  BREASTS: Not done, not pertinent to present illness.  GENITALIA: Not done, not pertinent to present illness.  EXTREMITIES: Right knee shows well healed anterior incision. Unable to fully   extend knee on exam. Effusion present. Painful  weightbearing.  IMPRESSION: Patella Tendon Tear of recent Right Total Knee Replacement.   PLAN: The patient admitted to [facility] to undergo a repair of the right patella tendon tear. Surgery will be performed by Dr. Frank Alusio. She went to [facility] for inpatient  rehab following her previous knee replacement hospital stay.   Jennett Tarbell L. Tobi Groesbeck, P.A.C.     

## 2011-06-26 ENCOUNTER — Ambulatory Visit (HOSPITAL_COMMUNITY): Payer: Medicare Other | Admitting: Anesthesiology

## 2011-06-26 ENCOUNTER — Encounter (HOSPITAL_COMMUNITY): Payer: Self-pay | Admitting: *Deleted

## 2011-06-26 ENCOUNTER — Ambulatory Visit (HOSPITAL_COMMUNITY)
Admission: RE | Admit: 2011-06-26 | Discharge: 2011-06-27 | Disposition: A | Discharge status: Home / Self Care | Payer: Medicare Other | Source: Ambulatory Visit | Attending: Orthopedic Surgery | Admitting: Orthopedic Surgery

## 2011-06-26 ENCOUNTER — Encounter (HOSPITAL_COMMUNITY): Payer: Self-pay | Admitting: Anesthesiology

## 2011-06-26 ENCOUNTER — Encounter (HOSPITAL_COMMUNITY)
Admission: RE | Disposition: A | Discharge status: Home / Self Care | Payer: Self-pay | Source: Ambulatory Visit | Attending: Orthopedic Surgery

## 2011-06-26 DIAGNOSIS — X500XXA Overexertion from strenuous movement or load, initial encounter: Secondary | ICD-10-CM | POA: Insufficient documentation

## 2011-06-26 DIAGNOSIS — S838X9A Sprain of other specified parts of unspecified knee, initial encounter: Secondary | ICD-10-CM | POA: Insufficient documentation

## 2011-06-26 DIAGNOSIS — J45909 Unspecified asthma, uncomplicated: Secondary | ICD-10-CM | POA: Insufficient documentation

## 2011-06-26 DIAGNOSIS — Z96659 Presence of unspecified artificial knee joint: Secondary | ICD-10-CM | POA: Insufficient documentation

## 2011-06-26 DIAGNOSIS — S86819A Strain of other muscle(s) and tendon(s) at lower leg level, unspecified leg, initial encounter: Secondary | ICD-10-CM

## 2011-06-26 DIAGNOSIS — I10 Essential (primary) hypertension: Secondary | ICD-10-CM | POA: Insufficient documentation

## 2011-06-26 DIAGNOSIS — K219 Gastro-esophageal reflux disease without esophagitis: Secondary | ICD-10-CM | POA: Insufficient documentation

## 2011-06-26 DIAGNOSIS — Y9301 Activity, walking, marching and hiking: Secondary | ICD-10-CM | POA: Insufficient documentation

## 2011-06-26 DIAGNOSIS — Z79899 Other long term (current) drug therapy: Secondary | ICD-10-CM | POA: Insufficient documentation

## 2011-06-26 DIAGNOSIS — E7800 Pure hypercholesterolemia, unspecified: Secondary | ICD-10-CM | POA: Insufficient documentation

## 2011-06-26 DIAGNOSIS — Y9289 Other specified places as the place of occurrence of the external cause: Secondary | ICD-10-CM | POA: Insufficient documentation

## 2011-06-26 HISTORY — DX: Asymptomatic varicose veins of unspecified lower extremity: I83.90

## 2011-06-26 HISTORY — DX: Unspecified osteoarthritis, unspecified site: M19.90

## 2011-06-26 HISTORY — DX: Pure hypercholesterolemia, unspecified: E78.00

## 2011-06-26 HISTORY — DX: Bursitis of unspecified shoulder: M75.50

## 2011-06-26 HISTORY — DX: Unspecified cataract: H26.9

## 2011-06-26 HISTORY — DX: Other specified postprocedural states: Z98.890

## 2011-06-26 HISTORY — DX: Essential (primary) hypertension: I10

## 2011-06-26 HISTORY — PX: PATELLAR TENDON REPAIR: SHX737

## 2011-06-26 HISTORY — DX: Barrett's esophagus without dysplasia: K22.70

## 2011-06-26 HISTORY — DX: Gastro-esophageal reflux disease without esophagitis: K21.9

## 2011-06-26 HISTORY — DX: Cardiac arrhythmia, unspecified: I49.9

## 2011-06-26 LAB — PROTIME-INR: Prothrombin Time: 15.6 seconds — ABNORMAL HIGH (ref 11.6–15.2)

## 2011-06-26 LAB — COMPREHENSIVE METABOLIC PANEL
ALT: 35 U/L (ref 0–35)
Albumin: 3.7 g/dL (ref 3.5–5.2)
BUN: 12 mg/dL (ref 6–23)
Calcium: 10.7 mg/dL — ABNORMAL HIGH (ref 8.4–10.5)
GFR calc Af Amer: >90 mL/min (ref >90)
Glucose, Bld: 84 mg/dL (ref 70–99)
Sodium: 135 mEq/L (ref 135–145)
Total Protein: 7 g/dL (ref 6.0–8.3)

## 2011-06-26 LAB — TYPE AND SCREEN
ABO/RH(D): A POS
Antibody Screen: NEGATIVE

## 2011-06-26 LAB — CBC
Hemoglobin: 11.9 g/dL — ABNORMAL LOW (ref 12.0–15.0)
MCH: 30.1 pg (ref 26.0–34.0)
MCHC: 32.6 g/dL (ref 30.0–36.0)
RDW: 13.7 % (ref 11.5–15.5)

## 2011-06-26 SURGERY — REPAIR, TENDON, PATELLAR
Anesthesia: General | Site: Knee | Laterality: Right | Wound class: Clean

## 2011-06-26 MED ORDER — HYDROMORPHONE HCL PF 1 MG/ML IJ SOLN
INTRAMUSCULAR | Status: AC
  Filled 2011-06-26: qty 1

## 2011-06-26 MED ORDER — PROMETHAZINE HCL 25 MG/ML IJ SOLN: 6.2500 mg | INTRAMUSCULAR | Status: DC | PRN

## 2011-06-26 MED ORDER — DEXAMETHASONE SODIUM PHOSPHATE 4 MG/ML IJ SOLN
INTRAMUSCULAR | Status: DC | PRN
  Administered 2011-06-26: 10 mg via INTRAVENOUS

## 2011-06-26 MED ORDER — LIDOCAINE HCL (CARDIAC) 20 MG/ML IV SOLN
INTRAVENOUS | Status: DC | PRN
  Administered 2011-06-26: 100 mg via INTRAVENOUS

## 2011-06-26 MED ORDER — SENNOSIDES-DOCUSATE SODIUM 8.6-50 MG PO TABS
2.0000 | ORAL_TABLET | Freq: Every day | ORAL | Status: DC
  Filled 2011-06-26: qty 2

## 2011-06-26 MED ORDER — OLMESARTAN MEDOXOMIL-HCTZ 40-12.5 MG PO TABS: 1.0000 | ORAL_TABLET | ORAL | Status: DC

## 2011-06-26 MED ORDER — TRAMADOL HCL 50 MG PO TABS: 50.0000 mg | ORAL_TABLET | Freq: Two times a day (BID) | ORAL | Status: DC | PRN

## 2011-06-26 MED ORDER — METOPROLOL TARTRATE 100 MG PO TABS
100.0000 mg | ORAL_TABLET | Freq: Every day | ORAL | Status: DC
  Filled 2011-06-26: qty 1

## 2011-06-26 MED ORDER — EPHEDRINE SULFATE 50 MG/ML IJ SOLN
INTRAMUSCULAR | Status: DC | PRN
  Administered 2011-06-26: 10 mg via INTRAVENOUS

## 2011-06-26 MED ORDER — DEXAMETHASONE SODIUM PHOSPHATE 10 MG/ML IJ SOLN: 10.0000 mg | Freq: Once | INTRAMUSCULAR | Status: DC

## 2011-06-26 MED ORDER — CEFAZOLIN SODIUM 1-5 GM-% IV SOLN
INTRAVENOUS | Status: AC
  Filled 2011-06-26: qty 50

## 2011-06-26 MED ORDER — FENTANYL CITRATE 0.05 MG/ML IJ SOLN
INTRAMUSCULAR | Status: DC | PRN
  Administered 2011-06-26: 50 ug via INTRAVENOUS
  Administered 2011-06-26: 100 ug via INTRAVENOUS
  Administered 2011-06-26 (×2): 50 ug via INTRAVENOUS
  Administered 2011-06-26: 100 ug via INTRAVENOUS

## 2011-06-26 MED ORDER — CEFAZOLIN SODIUM 1-5 GM-% IV SOLN
1.0000 g | INTRAVENOUS | Status: DC
  Administered 2011-06-26: 2 g via INTRAVENOUS

## 2011-06-26 MED ORDER — HYDROMORPHONE HCL PF 1 MG/ML IJ SOLN
0.2500 mg | INTRAMUSCULAR | Status: DC | PRN
  Administered 2011-06-26 (×4): 0.5 mg via INTRAVENOUS

## 2011-06-26 MED ORDER — RIVAROXABAN 10 MG PO TABS
10.0000 mg | ORAL_TABLET | Freq: Every day | ORAL | Status: DC
  Administered 2011-06-27: 10 mg via ORAL
  Filled 2011-06-26: qty 1

## 2011-06-26 MED ORDER — ZOLPIDEM TARTRATE 5 MG PO TABS: 5.0000 mg | ORAL_TABLET | Freq: Every evening | ORAL | Status: DC | PRN

## 2011-06-26 MED ORDER — ONDANSETRON HCL 4 MG/2ML IJ SOLN
INTRAMUSCULAR | Status: DC | PRN
  Administered 2011-06-26: 4 mg via INTRAVENOUS

## 2011-06-26 MED ORDER — LACTATED RINGERS IV SOLN: INTRAVENOUS | Status: DC

## 2011-06-26 MED ORDER — OXYCODONE HCL 5 MG PO TABS
5.0000 mg | ORAL_TABLET | ORAL | Status: DC | PRN
  Administered 2011-06-26 – 2011-06-27 (×4): 5 mg via ORAL
  Filled 2011-06-26 (×4): qty 1

## 2011-06-26 MED ORDER — POTASSIUM CHLORIDE CRYS ER 20 MEQ PO TBCR
40.0000 meq | EXTENDED_RELEASE_TABLET | Freq: Every day | ORAL | Status: DC
  Administered 2011-06-27: 40 meq via ORAL
  Filled 2011-06-26: qty 2

## 2011-06-26 MED ORDER — CEFAZOLIN SODIUM 1-5 GM-% IV SOLN
INTRAVENOUS | Status: AC
Start: 2011-06-26 — End: 2011-06-26
  Filled 2011-06-26: qty 50

## 2011-06-26 MED ORDER — METOCLOPRAMIDE HCL 10 MG PO TABS: 5.0000 mg | ORAL_TABLET | Freq: Three times a day (TID) | ORAL | Status: DC | PRN

## 2011-06-26 MED ORDER — METOCLOPRAMIDE HCL 5 MG/ML IJ SOLN: 5.0000 mg | Freq: Three times a day (TID) | INTRAMUSCULAR | Status: DC | PRN

## 2011-06-26 MED ORDER — METHOCARBAMOL 500 MG PO TABS
500.0000 mg | ORAL_TABLET | Freq: Four times a day (QID) | ORAL | Status: DC | PRN
  Administered 2011-06-26: 500 mg via ORAL
  Filled 2011-06-26: qty 1

## 2011-06-26 MED ORDER — EZETIMIBE 10 MG PO TABS
10.0000 mg | ORAL_TABLET | Freq: Every evening | ORAL | Status: DC
  Filled 2011-06-26: qty 1

## 2011-06-26 MED ORDER — CEFAZOLIN SODIUM 1-5 GM-% IV SOLN
1.0000 g | Freq: Four times a day (QID) | INTRAVENOUS | Status: AC
  Administered 2011-06-27 (×3): 1 g via INTRAVENOUS
  Filled 2011-06-26 (×4): qty 50

## 2011-06-26 MED ORDER — METOPROLOL SUCCINATE ER 100 MG PO TB24
100.0000 mg | ORAL_TABLET | Freq: Once | ORAL | Status: AC
  Administered 2011-06-26: 100 mg via ORAL
  Filled 2011-06-26: qty 1

## 2011-06-26 MED ORDER — OLMESARTAN MEDOXOMIL 40 MG PO TABS
40.0000 mg | ORAL_TABLET | Freq: Every day | ORAL | Status: DC
  Filled 2011-06-26: qty 1

## 2011-06-26 MED ORDER — PANTOPRAZOLE SODIUM 40 MG PO TBEC
80.0000 mg | DELAYED_RELEASE_TABLET | Freq: Every day | ORAL | Status: DC
  Administered 2011-06-27: 80 mg via ORAL
  Filled 2011-06-26: qty 2

## 2011-06-26 MED ORDER — SENNOSIDES-DOCUSATE SODIUM 8.6-50 MG PO TABS
1.0000 | ORAL_TABLET | Freq: Every evening | ORAL | Status: DC | PRN
  Filled 2011-06-26: qty 1

## 2011-06-26 MED ORDER — PROPOFOL 10 MG/ML IV EMUL
INTRAVENOUS | Status: DC | PRN
  Administered 2011-06-26: 20 mg via INTRAVENOUS
  Administered 2011-06-26: 160 mg via INTRAVENOUS

## 2011-06-26 MED ORDER — LACTATED RINGERS IV SOLN
INTRAVENOUS | Status: DC | PRN
  Administered 2011-06-26: 19:00:00 via INTRAVENOUS

## 2011-06-26 MED ORDER — CHLORHEXIDINE GLUCONATE 4 % EX LIQD: 60.0000 mL | Freq: Once | CUTANEOUS | Status: DC

## 2011-06-26 MED ORDER — SODIUM CHLORIDE 0.9 % IV SOLN: INTRAVENOUS | Status: DC

## 2011-06-26 MED ORDER — ONDANSETRON HCL 4 MG/2ML IJ SOLN: 4.0000 mg | Freq: Four times a day (QID) | INTRAMUSCULAR | Status: DC | PRN

## 2011-06-26 MED ORDER — LORATADINE 10 MG PO TABS
10.0000 mg | ORAL_TABLET | Freq: Every day | ORAL | Status: DC
Start: 2011-06-27 — End: 2011-06-27
  Administered 2011-06-27: 10 mg via ORAL
  Filled 2011-06-26: qty 1

## 2011-06-26 MED ORDER — MAGNESIUM CITRATE PO SOLN: 296.0000 mL | Freq: Once | ORAL | Status: DC

## 2011-06-26 MED ORDER — HYDROCHLOROTHIAZIDE 12.5 MG PO CAPS
12.5000 mg | ORAL_CAPSULE | Freq: Every day | ORAL | Status: DC
  Administered 2011-06-27: 12.5 mg via ORAL
  Filled 2011-06-26: qty 1

## 2011-06-26 MED ORDER — ONDANSETRON HCL 4 MG PO TABS: 4.0000 mg | ORAL_TABLET | Freq: Four times a day (QID) | ORAL | Status: DC | PRN

## 2011-06-26 MED ORDER — ACETAMINOPHEN 10 MG/ML IV SOLN: 1000.0000 mg | Freq: Once | INTRAVENOUS | Status: DC

## 2011-06-26 MED ORDER — MIDAZOLAM HCL 5 MG/5ML IJ SOLN
INTRAMUSCULAR | Status: DC | PRN
  Administered 2011-06-26 (×2): 1 mg via INTRAVENOUS

## 2011-06-26 MED ORDER — POLYETHYLENE GLYCOL 3350 17 G PO PACK
17.0000 g | PACK | Freq: Every day | ORAL | Status: DC | PRN
  Filled 2011-06-26: qty 1

## 2011-06-26 MED ORDER — SODIUM CHLORIDE 0.9 % IV SOLN
INTRAVENOUS | Status: DC
  Administered 2011-06-27: 01:00:00 via INTRAVENOUS

## 2011-06-26 SURGICAL SUPPLY — 45 items
BAG SPEC THK2 15X12 ZIP CLS (MISCELLANEOUS) ×1
BAG ZIPLOCK 12X15 (MISCELLANEOUS) ×2 IMPLANT
BANDAGE ELASTIC 6 VELCRO ST LF (GAUZE/BANDAGES/DRESSINGS) ×1 IMPLANT
BANDAGE ESMARK 6X9 LF (GAUZE/BANDAGES/DRESSINGS) ×1 IMPLANT
BANDAGE GAUZE ELAST BULKY 4 IN (GAUZE/BANDAGES/DRESSINGS) ×2 IMPLANT
BIT DRILL 2.8X128 (BIT) ×2 IMPLANT
BNDG CMPR 9X6 STRL LF SNTH (GAUZE/BANDAGES/DRESSINGS) ×1
BNDG COHESIVE 6X5 TAN STRL LF (GAUZE/BANDAGES/DRESSINGS) ×2 IMPLANT
BNDG ESMARK 6X9 LF (GAUZE/BANDAGES/DRESSINGS) ×2
CLOSURE STERI STRIP 1/2 X4 (GAUZE/BANDAGES/DRESSINGS) ×1 IMPLANT
CLOTH BEACON ORANGE TIMEOUT ST (SAFETY) ×2 IMPLANT
DECANTER SPIKE VIAL GLASS SM (MISCELLANEOUS) ×2 IMPLANT
DRAPE U-SHAPE 47X51 STRL (DRAPES) ×2 IMPLANT
DRSG ADAPTIC 3X8 NADH LF (GAUZE/BANDAGES/DRESSINGS) ×1 IMPLANT
DRSG EMULSION OIL 3X16 NADH (GAUZE/BANDAGES/DRESSINGS) ×2 IMPLANT
DRSG PAD ABDOMINAL 8X10 ST (GAUZE/BANDAGES/DRESSINGS) ×2 IMPLANT
DURAPREP 26ML APPLICATOR (WOUND CARE) ×2 IMPLANT
ELECT REM PT RETURN 9FT ADLT (ELECTROSURGICAL) ×2
ELECTRODE REM PT RTRN 9FT ADLT (ELECTROSURGICAL) ×1 IMPLANT
GLOVE BIO SURGEON STRL SZ7.5 (GLOVE) ×2 IMPLANT
GLOVE BIO SURGEON STRL SZ8 (GLOVE) ×2 IMPLANT
GLOVE BIOGEL PI IND STRL 8 (GLOVE) ×2 IMPLANT
GLOVE BIOGEL PI INDICATOR 8 (GLOVE) ×2
GOWN PREVENTION PLUS XLARGE (GOWN DISPOSABLE) ×2 IMPLANT
GOWN STRL REIN XL XLG (GOWN DISPOSABLE) ×2 IMPLANT
IMMOBILIZER KNEE 22 UNIV (SOFTGOODS) ×1 IMPLANT
MANIFOLD NEPTUNE II (INSTRUMENTS) ×2 IMPLANT
PACK TOTAL JOINT (CUSTOM PROCEDURE TRAY) ×2 IMPLANT
PADDING WEBRIL 6 STERILE (GAUZE/BANDAGES/DRESSINGS) ×1 IMPLANT
PASSER SUT SWANSON 36MM LOOP (INSTRUMENTS) ×2 IMPLANT
POSITIONER SURGICAL ARM (MISCELLANEOUS) ×2 IMPLANT
SPONGE GAUZE 4X4 12PLY (GAUZE/BANDAGES/DRESSINGS) ×2 IMPLANT
SPONGE LAP 18X18 X RAY DECT (DISPOSABLE) ×4 IMPLANT
SPONGE LAP 4X18 X RAY DECT (DISPOSABLE) ×4 IMPLANT
STRIP CLOSURE SKIN 1/2X4 (GAUZE/BANDAGES/DRESSINGS) ×2 IMPLANT
SUT ETHIBOND NAB CT1 #1 30IN (SUTURE) ×4 IMPLANT
SUT MNCRL AB 4-0 PS2 18 (SUTURE) ×2 IMPLANT
SUT VIC AB 0 CT1 27 (SUTURE) ×4
SUT VIC AB 0 CT1 27XBRD ANTBC (SUTURE) ×2 IMPLANT
SUT VIC AB 1 CT1 27 (SUTURE) ×2
SUT VIC AB 1 CT1 27XBRD ANTBC (SUTURE) ×1 IMPLANT
SUT VIC AB 2-0 CT1 27 (SUTURE) ×2
SUT VIC AB 2-0 CT1 TAPERPNT 27 (SUTURE) ×1 IMPLANT
TOWEL OR 17X26 10 PK STRL BLUE (TOWEL DISPOSABLE) ×4 IMPLANT
WATER STERILE IRR 1500ML POUR (IV SOLUTION) ×2 IMPLANT

## 2011-06-26 NOTE — Brief Op Note (Signed)
06/26/2011  8:13 PM  PATIENT:  Nancy Sosa  70 y.o. female  PRE-OPERATIVE DIAGNOSIS:  Right Patella Tendon Tear  POST-OPERATIVE DIAGNOSIS:  Right knee extensor mechanism disruption  PROCEDURE:  Procedure(s): PATELLA TENDON REPAIR  SURGEON:  Surgeon(s): Gus Rankin Yigit Norkus  PHYSICIAN ASSISTANT:   ASSISTANTS: [name], PA-C   ANESTHESIA:   general  EBL:     BLOOD ADMINISTERED:none  DRAINS: none   LOCAL MEDICATIONS USED:  NONE  SPECIMEN:  No Specimen  DISPOSITION OF SPECIMEN:  N/A  COUNTS:  YES  TOURNIQUET:   Total Tourniquet Time Documented: Thigh (laterality) - 19 minutes  DICTATION: .Other Dictation: Dictation Number [id number]  PLAN OF CARE: Admit for overnight observation  PATIENT DISPOSITION:  PACU - hemodynamically stable.   Delay start of Pharmacological VTE agent (>24hrs) due to surgical blood loss or risk of bleeding:  yes

## 2011-06-26 NOTE — H&P (View-Only) (Signed)
NAMEMarland Sosa Sosa, VOLCY NO.: [id number]  MEDICAL RECORD NO.: [id number]   PHYSICIAN: Ollen Gross, M.D. DATE OF BIRTH: 04-Mar-1941   DATE OF ADMISSION: 06/26/2011   HISTORY & PHYSICAL   CHIEF COMPLAINT: Right knee pain.   HISTORY OF PRESENT ILLNESS: The patient is a 70 year old female who has  been seen by Dr. Despina Hick for ongoing bilateral knee pain. The right has been  more symptomatic and problematic than the left.  She just underwent a total knee replacement for her arthritis recently back on 06/05/2011.  Unfortunately, while she was in rehab, she sustained a rupture of her patella tendon. She was seen in the office in follow up and it is felt she would benefit from  undergoing surgical intervention. Risks and benefits have been  discussed. She elected to proceed with surgery. No obvious  contraindications to surgery such as ongoing infection or progressive  neurological disease.   ALLERGIES:  1. STATIN DRUGS.  2. PENICILLIN.  3. SEPTRA.  4. BACTRIM  CURRENT MEDICATIONS: acetaminophen (TYLENOL) 325 MG tablet esomeprazole (NEXIUM) 40 MG capsule ezetimibe (ZETIA) 10 MG tablet loratadine (CLARITIN) 10 MG tablet magnesium citrate 1.745 GM/30ML SOLN menthol-cetylpyridinium (CEPACOL) 3 MG lozenge methocarbamol (ROBAXIN) 500 MG tablet metoprolol (LOPRESSOR) 100 MG tablet olmesartan-hydrochlorothiazide (BENICAR HCT) 40-12.5 MG per tablet oxycodone (OXY-IR) 5 MG capsule polyethylene glycol (MIRALAX / GLYCOLAX) packet potassium chloride SA (K-DUR,KLOR-CON) 20 MEQ tablet sennosides-docusate sodium (SENOKOT-S) 8.6-50 MG tablet  traMADol (ULTRAM) 50 MG tablet  zolpidem (AMBIEN) 5 MG tablet  PAST MEDICAL HISTORY:  1. Occasional headaches.  2. Impaired vision, wears glasses.  3. Seasonal allergies.  4. History of asthma.  5. History of bronchitis.  6. Hypertension.  7. Reflux disease.  8. Barrett esophagitis.  9. Hypercholesterolemia.  10.History of heart  arrhythmia/tachycardia.  11.Early cataracts.  12.Varicose veins.   PAST SURGICAL HISTORY:  1. Tonsillectomy.  2. Hysterectomy.  3. Removal of fibroid tumors from right hand and breast.  4. Arthroscopic knee surgery bilaterally.  5. Left foot bunion.  6. Gallbladder surgery.  7. Right Total Knee Replacement 06/05/11  FAMILY HISTORY: Noncontributory.   SOCIAL HISTORY: Widowed. Retired. Nonsmoker, no alcohol. Lives  alone. She went to [facility] recently following her knee replacement.  REVIEW OF SYSTEMS: GENERAL: No fevers or chills. Occasional night  sweats. NEUROLOGIC: Occasional headaches. No seizure, syncope or  paralysis. RESPIRATORY: A little bit of shortness of breath on  exertion, but no shortness of breath, productive cough or hemoptysis.  CARDIOVASCULAR: No chest pain, angina or orthopnea. She does have a  history of tachycardia. GI: No nausea, vomiting, diarrhea or  constipation. Occasional heartburn. GU: No dysuria or hematuria.  Does have a little bit of frequency and nocturia. MUSCULOSKELETAL:  Knee pain.   PHYSICAL EXAMINATION: VITAL SIGNS: Pulse 68, respirations 14, blood  pressure 132/82.   GENERAL: This is a 70 year old African American female, well nourished,  well developed, overweight, in no acute distress. She is alert,  oriented, cooperative and pleasant.  HEENT: Normocephalic and  atraumatic. Pupils are round, reactive. EOMs intact.  NECK: Supple. No carotid bruits.  CHEST: Clear anterior posterior chest walls. No rhonchi, rales or  wheezing.  HEART: Regular rate and rhythm. No murmur. S1, S2 noted.  ABDOMEN: Soft, round, protuberant abdomen. Bowel sounds present.  RECTAL: Not done, not pertinent to present illness.  BREASTS: Not done, not pertinent to present illness.  GENITALIA: Not done, not pertinent to present illness.  EXTREMITIES: Right knee shows well healed anterior incision. Unable to fully  extend knee on exam. Effusion present. Painful  weightbearing.  IMPRESSION: Patella Tendon Tear of recent Right Total Knee Replacement.   PLAN: The patient admitted to [facility] to undergo a repair of the right patella tendon tear. Surgery will be performed by Dr. Trudee Grip. She went to [facility] for inpatient  rehab following her previous knee replacement hospital stay.   Nancy Sosa, P.A.C.

## 2011-06-26 NOTE — Transfer of Care (Signed)
Immediate Anesthesia Transfer of Care Note  Patient: Nancy Sosa  Procedure(s) Performed:  PATELLA TENDON REPAIR - Repair of Extensor Mechanisim  Patient Location: PACU  Anesthesia Type: General  Level of Consciousness: awake and sedated  Airway & Oxygen Therapy: Patient Spontanous Breathing and Patient connected to face mask oxygen  Post-op Assessment: Report given to PACU RN and Post -op Vital signs reviewed and stable  Post vital signs: Reviewed and stable  Complications: No apparent anesthesia complications

## 2011-06-26 NOTE — Anesthesia Preprocedure Evaluation (Addendum)
Anesthesia Evaluation  Patient identified by MRN, date of birth, ID band Patient awake    Reviewed: Allergy & Precautions, H&P , NPO status , Patient's Chart, lab work & pertinent test results  Airway Mallampati: II TM Distance: >3 FB Neck ROM: Full    Dental  (+) Teeth Intact   Pulmonary neg pulmonary ROS,  clear to auscultation        Cardiovascular neg cardio ROS Regular     Neuro/Psych Negative Neurological ROS  Negative Psych ROS   GI/Hepatic negative GI ROS, Neg liver ROS,   Endo/Other  Negative Endocrine ROS  Renal/GU negative Renal ROS  Genitourinary negative   Musculoskeletal negative musculoskeletal ROS (+)   Abdominal   Peds negative pediatric ROS (+)  Hematology negative hematology ROS (+)   Anesthesia Other Findings   Reproductive/Obstetrics negative OB ROS                          Anesthesia Physical Anesthesia Plan  ASA: II  Anesthesia Plan: General   Post-op Pain Management:    Induction:   Airway Management Planned: LMA  Additional Equipment:   Intra-op Plan:   Post-operative Plan: Extubation in OR  Informed Consent: I have reviewed the patients History and Physical, chart, labs and discussed the procedure including the risks, benefits and alternatives for the proposed anesthesia with the patient or authorized representative who has indicated his/her understanding and acceptance.   Dental advisory given  Plan Discussed with: CRNA  Anesthesia Plan Comments: (History of very sore throat after general anesthesia 3 weeks ago. Will try lma hoping it will be less traumatic.)        Anesthesia Quick Evaluation

## 2011-06-26 NOTE — Anesthesia Postprocedure Evaluation (Signed)
  Anesthesia Post-op Note  Patient: Nancy Sosa  Procedure(s) Performed:  PATELLA TENDON REPAIR - Repair of Extensor Mechanisim  Patient Location: PACU  Anesthesia Type: General  Level of Consciousness: awake and alert   Airway and Oxygen Therapy: Patient Spontanous Breathing  Post-op Pain: mild  Post-op Assessment: Post-op Vital signs reviewed, Patient's Cardiovascular Status Stable, Respiratory Function Stable, Patent Airway and No signs of Nausea or vomiting  Post-op Vital Signs: stable  Complications: No apparent anesthesia complications

## 2011-06-26 NOTE — Interval H&P Note (Signed)
History and Physical Interval Note:   06/26/2011   6:29 PM   Nancy Sosa Nancy Sosa  has presented today for surgery, with the diagnosis of Right Patella Tendon Tear  The various methods of treatment have been discussed with the patient and family. After consideration of risks, benefits and other options for treatment, the patient has consented to  Procedure(s): PATELLA TENDON REPAIR as a surgical intervention .  The patients' history has been reviewed, patient examined, no change in status, stable for surgery.  I have reviewed the patients' chart and labs.  Questions were answered to the patient's satisfaction.     Loanne Drilling  MD  Pt examined. History and physical unchanged  Gus Rankin. Semira Stoltzfus, MD    06/26/2011, 6:29 PM

## 2011-06-27 ENCOUNTER — Encounter (HOSPITAL_COMMUNITY): Payer: Self-pay | Admitting: Orthopedic Surgery

## 2011-06-27 MED ORDER — METHOCARBAMOL 500 MG PO TABS: 500.0000 mg | ORAL_TABLET | Freq: Four times a day (QID) | ORAL | Status: DC | PRN

## 2011-06-27 MED ORDER — OXYCODONE HCL 5 MG PO CAPS: 5.0000 mg | ORAL_CAPSULE | ORAL | Status: DC | PRN

## 2011-06-27 NOTE — Progress Notes (Signed)
CM consult done. See CM notes in shadow chart.  Kaiden Dardis Wyche RN BSN CCM [phone] 06/27/2011    

## 2011-06-27 NOTE — Progress Notes (Signed)
Physical Therapy Evaluation Patient Details Name: Nancy Sosa MRN: [id number] DOB: 1941-05-07 Today's Date: 06/27/2011 0915 - 0940  EVAL Problem List: There is no problem list on file for this patient.   Past Medical History:  Past Medical History  Diagnosis Date  . Arthritis   . Asthma     also seasonal allergies/bronchitis  . Hypertension   . GERD (gastroesophageal reflux disease)   . Barrett's esophagus   . Hypercholesterolemia   . Dysrhythmia     chronic tachycardia  . Cataracts, bilateral   . Varicose veins   . Bursitis of shoulder   . S/P left knee arthroscopy   . S/P right knee arthroscopy    Past Surgical History:  Past Surgical History  Procedure Date  . Tonsillectomy   . Knee arthroscopy   . Fibroids hand removed   . Fibroids breast removed   . Abdominal hysterectomy   . Cholecystectomy     PT Assessment/Plan/Recommendation PT Assessment Clinical Impression Statement: Pt with patellar tendon repair presents with decreased functional mobility  and questionable saftey awareness PT Recommendation/Assessment: Patient will need skilled PT in the acute care venue PT Problem List: Decreased activity tolerance;Decreased mobility;Decreased knowledge of use of DME;Decreased safety awareness PT Therapy Diagnosis : Difficulty walking PT Plan PT Frequency: 7X/week PT Treatment/Interventions: DME instruction;Gait training;Stair training;Functional mobility training;Patient/family education PT Recommendation Follow Up Recommendations: Home health PT PT Goals  Acute Rehab PT Goals PT Goal Formulation: With patient Pt will go Supine/Side to Sit: with modified independence PT Goal: Supine/Side to Sit - Progress: Not met Pt will go Sit to Supine/Side: with modified independence PT Goal: Sit to Supine/Side - Progress: Not met Pt will Transfer Sit to Stand/Stand to Sit: with modified independence PT Transfer Goal: Sit to Stand/Stand to Sit - Progress: Not met Pt will  Ambulate: 51 - 150 feet;with modified independence;with rolling walker PT Goal: Ambulate - Progress: Not met Pt will Go Up / Down Stairs: 3-5 stairs;with least restrictive assistive device;with min assist PT Goal: Up/Down Stairs - Progress: Not met  PT Evaluation Precautions/Restrictions  Precautions Precautions: Other (comment) Precaution Comments: WBAT: KI all times with no R knee flex Required Braces or Orthoses: Yes Knee Immobilizer: On at all times Restrictions Weight Bearing Restrictions: No Prior Functioning  Home Living Lives With: Alone Receives Help From: Family Type of Home: House Home Layout: One level Home Access: Stairs to enter Entrance Stairs-Rails: Right Entrance Stairs-Number of Steps: 3 Prior Function Level of Independence:  (Pt in SNF with TKR about to be released home when rupture oc) Able to Take Stairs?: Yes Cognition Cognition Arousal/Alertness: Awake/alert Overall Cognitive Status: Appears within functional limits for tasks assessed Orientation Level: Oriented X4 Sensation/Coordination Coordination Gross Motor Movements are Fluid and Coordinated: Yes Extremity Assessment RUE Assessment RUE Assessment: Within Functional Limits LUE Assessment LUE Assessment: Within Functional Limits RLE Assessment RLE Assessment:  (Hip and ankle WFL - no ROM at R knee allowed) LLE Assessment LLE Assessment: Within Functional Limits Mobility (including Balance) Bed Mobility Bed Mobility: Yes Supine to Sit: 5: Supervision;4: Min assist Supine to Sit Details (indicate cue type and reason):  (R LE management) Transfers Transfers: Yes Sit to Stand: 5: Supervision;4: Min assist;From bed;From chair/3-in-1;With upper extremity assist;With armrests Sit to Stand Details (indicate cue type and reason): cues for LE position and use of UEs Stand to Sit: 4: Min assist;5: Supervision;With armrests;With upper extremity assist;To chair/3-in-1;To bed Stand to Sit Details: cues  for LE position and use of UES Ambulation/Gait  Ambulation/Gait: Yes Ambulation/Gait Assistance: 5: Supervision;4: Min assist Ambulation/Gait Assistance Details (indicate cue type and reason): sequence, posture and position from RW Ambulation Distance (Feet):  (20 and 100') Assistive device: Rolling walker Gait Pattern: Step-to pattern    Exercise    End of Session PT - End of Session Activity Tolerance: Patient tolerated treatment well Patient left: in chair;with call bell in reach Nurse Communication: Mobility status for transfers General Behavior During Session: [facility] for tasks performed Cognition: [facility] for tasks performed  Yuma Pacella 06/27/2011, 12:19 PM

## 2011-06-27 NOTE — Progress Notes (Signed)
Subjective: 1 Day Post-Op Procedure(s) (LRB): PATELLA TENDON REPAIR (Right) Patient reports pain as mild.   Patient seen in rounds with Dr. Lequita Halt. Patient has complaints of some mild pain, doing OK. Wants to go home  today.  Objective: Vital signs in last 24 hours: Temp:  [98 F (36.7 C)-98.8 F (37.1 C)] 98.8 F (37.1 C) (11/20 0110) Pulse Rate:  [78-97] 78  (11/20 0110) Resp:  [13-21] 20  (11/20 0110) BP: (110-146)/(63-87) 120/68 mmHg (11/20 0110) SpO2:  [92 %-100 %] 93 % (11/20 0110) Weight:  [95.255 kg (210 lb)] 210 lb (95.255 kg) (11/19 1603)  Intake/Output from previous day:  Intake/Output Summary (Last 24 hours) at 06/27/11 0700 Last data filed at 06/27/11 0600  Gross per 24 hour  Intake   1750 ml  Output    375 ml  Net   1375 ml    Intake/Output this shift: Total I/O In: 1750 [I.V.:1600; Other:150] Out: 375 [Urine:325; Blood:50]  Labs: Results for orders placed during the hospital encounter of 06/26/11  APTT      Component Value Range   aPTT 34  24 - 37 (seconds)  CBC      Component Value Range   WBC 5.4  4.0 - 10.5 (K/uL)   RBC 3.95  3.87 - 5.11 (MIL/uL)   Hemoglobin 11.9 (*) 12.0 - 15.0 (g/dL)   HCT 46.9  62.9 - 52.8 (%)   MCV 92.4  78.0 - 100.0 (fL)   MCH 30.1  26.0 - 34.0 (pg)   MCHC 32.6  30.0 - 36.0 (g/dL)   RDW 41.3  24.4 - 01.0 (%)   Platelets 385  150 - 400 (K/uL)  COMPREHENSIVE METABOLIC PANEL      Component Value Range   Sodium 135  135 - 145 (mEq/L)   Potassium 3.6  3.5 - 5.1 (mEq/L)   Chloride 100  96 - 112 (mEq/L)   CO2 24  19 - 32 (mEq/L)   Glucose, Bld 84  70 - 99 (mg/dL)   BUN 12  6 - 23 (mg/dL)   Creatinine, Ser 2.72  0.50 - 1.10 (mg/dL)   Calcium 53.6 (*) 8.4 - 10.5 (mg/dL)   Total Protein 7.0  6.0 - 8.3 (g/dL)   Albumin 3.7  3.5 - 5.2 (g/dL)   AST 32  0 - 37 (U/L)   ALT 35  0 - 35 (U/L)   Alkaline Phosphatase 63  39 - 117 (U/L)   Total Bilirubin 0.4  0.3 - 1.2 (mg/dL)   GFR calc non Af Amer 89 (*) >90 (mL/min)   GFR calc  Af Amer >90  >90 (mL/min)  PROTIME-INR      Component Value Range   Prothrombin Time 15.6 (*) 11.6 - 15.2 (seconds)   INR 1.21  0.00 - 1.49   TYPE AND SCREEN      Component Value Range   ABO/RH(D) A POS     Antibody Screen NEG     Sample Expiration 06/29/2011    SURGICAL PCR SCREEN      Component Value Range   MRSA, PCR NEGATIVE  NEGATIVE    Staphylococcus aureus NEGATIVE  NEGATIVE     Exam: Neurovascular intact Sensation intact distally Intact pulses distally DSG - clean, dry, no drainage Motor function intact - moving foot and toes well on exam.   Assessment/Plan: 1 Day Post-Op Procedure(s) (LRB): PATELLA TENDON REPAIR (Right) Advance diet Up with therapy D/C IV fluids Discharge home with home health Procedure(s) (LRB): PATELLA TENDON REPAIR (  Right) Past Medical History  Diagnosis Date  . Arthritis   . Asthma     also seasonal allergies/bronchitis  . Hypertension   . GERD (gastroesophageal reflux disease)   . Barrett's esophagus   . Hypercholesterolemia   . Dysrhythmia     chronic tachycardia  . Cataracts, bilateral   . Varicose veins   . Bursitis of shoulder   . S/P left knee arthroscopy   . S/P right knee arthroscopy     Diet - regular F/U in 2 weeks WBAT COD - Stable Meds in Summary DVT Prophylaxis - Xarelto / Coumadin Protocol   Nancy Sosa 06/27/2011, 7:00 AM

## 2011-06-27 NOTE — Progress Notes (Signed)
Physical Therapy Treatment Patient Details Name: Nancy Sosa MRN: [id number] DOB: 1941/05/06 Today's Date: 06/27/2011 1433 - 1402, GT, TA PT Assessment/Plan  PT - Assessment/Plan PT Plan: Discharge plan remains appropriate PT Goals  Acute Rehab PT Goals PT Goal: Sit to Supine/Side - Progress: Met PT Transfer Goal: Sit to Stand/Stand to Sit - Progress: Met PT Goal: Ambulate - Progress: Partly met PT Goal: Up/Down Stairs - Progress: Met  PT Treatment Precautions/Restrictions  Precautions Precautions: Other (comment) Precaution Comments: WBAT: KI all times with no R knee flex Required Braces or Orthoses: Yes Knee Immobilizer: On at all times Restrictions Weight Bearing Restrictions: No Mobility (including Balance) Bed Mobility Bed Mobility: No Transfers Sit to Stand: 6: Modified independent (Device/Increase time) Stand to Sit: 6: Modified independent (Device/Increase time) Ambulation/Gait Ambulation/Gait: Yes Ambulation/Gait Assistance: 5: Supervision Ambulation/Gait Assistance Details (indicate cue type and reason): cues for position from RW and posture Ambulation Distance (Feet): 140 Feet Assistive device: Rolling walker Gait Pattern: Step-to pattern Stairs: Yes Stairs Assistance: 4: Min assist Stairs Assistance Details (indicate cue type and reason): cues for sequence and foot/crutch placement - written instruction provided Stair Management Technique: One rail Right;Step to pattern;Forwards;With crutches Number of Stairs: 4     Exercise    End of Session PT - End of Session Activity Tolerance: Patient tolerated treatment well Patient left: in chair General Behavior During Session: [facility] for tasks performed Cognition: [facility] for tasks performed  Nancy Sosa 06/27/2011, 4:12 PM

## 2011-06-27 NOTE — Op Note (Signed)
NAMEMALON, SIDDALL NO.:  [id number]  MEDICAL RECORD NO.:  [id number]  LOCATION:  1620                         FACILITY:  [facility]  PHYSICIAN:  Ollen Gross, M.D.    DATE OF BIRTH:  02-11-1941  DATE OF PROCEDURE: DATE OF DISCHARGE:                              OPERATIVE REPORT   PREOPERATIVE DIAGNOSIS:  Right patella tendon rupture.  POSTOPERATIVE DIAGNOSIS:  Right extensor mechanism tear.  PROCEDURE:  Right knee extensor mechanism repair.  SURGEON:  Ollen Gross, M.D.  ASSISTANT:  Alexzandrew L. Perkins, P.A.C.  ANESTHESIA:  General.  ESTIMATED BLOOD LOSS:  Minimal.  DRAINS:  None.  TOURNIQUET TIME:  Approximately 20 minutes at 300 mmHg.  COMPLICATIONS:  None.  CONDITION:  Stable to Recovery.  BRIEF CLINICAL INDICATION:  Ms. Hoston is a 70 year old female who underwent right total knee arthroplasty approximate 3 weeks ago.  She was doing fantastic with her physical therapy at her 2-week postop visit.  The next day, however, she was on the stair stepper at rehab and felt a sharp pain after she walked.  She was unable to do a straight leg raise after that.  We saw her in the office the ensuing day and noticed that she could not do a straight leg raise.  She had slight elevation of her patella in comparison to preop.  I felt she may have had a partial or full patellar tendon rupture.  She presents now for exploration and repair.  PROCEDURE IN DETAIL:  After successful administration of general anesthetic, tourniquet placed on the right thigh, right lower extremity prepped and draped in the usual sterile fashion.  Extremities wrapped in Esmarch, tourniquet inflated 300 mmHg.  Midline incision was made with a 10-blade through subcutaneous tissue.  We created subcu flaps medially and laterally.  It was noted that the inferomedial aspect of her extensor mechanism repair was torn and the sutures were popped out.  I removed the sutures.  This was the  medial portion of her arthrotomy just inferior to patella.  The patellar tendon in and of itself was 90% intact.  There was very small area medially that had separated from the retinaculum.  I copiously irrigated the joint about a liter of saline solution.  I flexed the knee 90 degrees and did retinacular repair at 90 degrees with interrupted #1 PDS suture.  I was able to then flex down about 115 and the repair stayed intact.  I went full extension, the tissue was very pliable.  We then further irrigated the wound and released tourniquet, total time approximately 20 minutes.  Subcu was closed with interrupted 2-0 Vicryl and subcuticular running 4-0 Monocryl.  Incision was cleaned and dried and Steri-Strips and a bulky sterile dressing applied.  She was placed into a knee immobilizer, awakened, and transported to Recovery in stable condition.     Ollen Gross, M.D.     FA/MEDQ  D:  06/26/2011  T:  06/27/2011  Job:  [id number]

## 2011-06-27 NOTE — Plan of Care (Signed)
Problem: Diagnosis - Type of Surgery Goal: General Surgical Patient Education (See Patient Education module for education specifics) Rt patella tendon repair surgery

## 2011-06-27 NOTE — Discharge Summary (Signed)
Physician Discharge Summary   [name] MRN: [id number] DOB/AGE: Nov 18, 1940 70 y.o.  Admit date: 06/26/2011 Discharge date: 06/27/2011  Primary Diagnosis: Patella Tendon Tear of recent Right Total Knee Replacement.   Admission Diagnoses: Past Medical History  Diagnosis Date  . Arthritis   . Asthma     also seasonal allergies/bronchitis  . Hypertension   . GERD (gastroesophageal reflux disease)   . Barrett's esophagus   . Hypercholesterolemia   . Dysrhythmia     chronic tachycardia  . Cataracts, bilateral   . Varicose veins   . Bursitis of shoulder   . S/P left knee arthroscopy   . S/P right knee arthroscopy     Discharge Diagnoses:  Active Problems:  * No active hospital problems. *    Procedure: Procedure(s) (LRB): PATELLA TENDON REPAIR (Right)   Consults: none  HPI:  Nancy Sosa is a 70 year old female who  underwent right total knee arthroplasty approximate 3 weeks ago. She  was doing fantastic with her physical therapy at her 2-week postop  visit. The next day, however, she was on the stair stepper at rehab and  felt a sharp pain after she walked. She was unable to do a straight leg  raise after that. We saw her in the office the ensuing day and noticed  that she could not do a straight leg raise. She had slight elevation of  her patella in comparison to preop. I felt she may have had a partial  or full patellar tendon rupture. She presents now for exploration and  repair.     Laboratory Data: Results for orders placed during the hospital encounter of 06/26/11  APTT      Component Value Range   aPTT 34  24 - 37 (seconds)  CBC      Component Value Range   WBC 5.4  4.0 - 10.5 (K/uL)   RBC 3.95  3.87 - 5.11 (MIL/uL)   Hemoglobin 11.9 (*) 12.0 - 15.0 (g/dL)   HCT 78.2  95.6 - 21.3 (%)   MCV 92.4  78.0 - 100.0 (fL)   MCH 30.1  26.0 - 34.0 (pg)   MCHC 32.6  30.0 - 36.0 (g/dL)   RDW 08.6  57.8 - 46.9 (%)   Platelets 385  150 - 400  (K/uL)  COMPREHENSIVE METABOLIC PANEL      Component Value Range   Sodium 135  135 - 145 (mEq/L)   Potassium 3.6  3.5 - 5.1 (mEq/L)   Chloride 100  96 - 112 (mEq/L)   CO2 24  19 - 32 (mEq/L)   Glucose, Bld 84  70 - 99 (mg/dL)   BUN 12  6 - 23 (mg/dL)   Creatinine, Ser 6.29  0.50 - 1.10 (mg/dL)   Calcium 52.8 (*) 8.4 - 10.5 (mg/dL)   Total Protein 7.0  6.0 - 8.3 (g/dL)   Albumin 3.7  3.5 - 5.2 (g/dL)   AST 32  0 - 37 (U/L)   ALT 35  0 - 35 (U/L)   Alkaline Phosphatase 63  39 - 117 (U/L)   Total Bilirubin 0.4  0.3 - 1.2 (mg/dL)   GFR calc non Af Amer 89 (*) >90 (mL/min)   GFR calc Af Amer >90  >90 (mL/min)  PROTIME-INR      Component Value Range   Prothrombin Time 15.6 (*) 11.6 - 15.2 (seconds)   INR 1.21  0.00 - 1.49   TYPE AND SCREEN      Component  Value Range   ABO/RH(D) A POS     Antibody Screen NEG     Sample Expiration 06/29/2011    SURGICAL PCR SCREEN      Component Value Range   MRSA, PCR NEGATIVE  NEGATIVE    Staphylococcus aureus NEGATIVE  NEGATIVE     Admission on 06/05/2011, Discharged on 06/08/2011  Component Date Value Range Status  . ABO/RH(D)  06/05/2011 A POS   Final  . Antibody Screen  06/05/2011 NEG   Final  . Sample Expiration  06/05/2011 06/08/2011   Final  . ABO/RH(D)  06/05/2011 A POS   Final  . WBC (K/uL) 06/06/2011 11.6* 4.0-10.5 Final  . RBC (MIL/uL) 06/06/2011 4.10  3.87-5.11 Final  . Hemoglobin (g/dL) 40/98/1191 47.8  29.5-62.1 Final  . HCT (%) 06/06/2011 36.5  36.0-46.0 Final  . MCV (fL) 06/06/2011 89.0  78.0-100.0 Final  . MCH (pg) 06/06/2011 29.8  26.0-34.0 Final  . MCHC (g/dL) 30/86/5784 69.6  29.5-28.4 Final  . RDW (%) 06/06/2011 13.1  11.5-15.5 Final  . Platelets (K/uL) 06/06/2011 240  150-400 Final  . Sodium (mEq/L) 06/06/2011 132* 135-145 Final  . Potassium (mEq/L) 06/06/2011 3.8  3.5-5.1 Final  . Chloride (mEq/L) 06/06/2011 96  96-112 Final  . CO2 (mEq/L) 06/06/2011 27  19-32 Final  . Glucose, Bld (mg/dL) 13/24/4010 272* 53-66  Final  . BUN (mg/dL) 44/10/4740 17  5-95 Final  . Creatinine, Ser (mg/dL) 63/87/5643 3.29  5.18-8.41 Final  . Calcium (mg/dL) 66/01/3015 9.5  0.1-09.3 Final  . GFR calc non Af Amer (mL/min) 06/06/2011 85* >90 Final  . GFR calc Af Amer (mL/min) 06/06/2011 >90  >90 Final   Comment:                                 The eGFR has been calculated                          using the CKD EPI equation.                          This calculation has not been                          validated in all clinical                          situations.                          eGFR's persistently                          <90 mL/min signify                          possible Chronic Kidney Disease.  . WBC (K/uL) 06/07/2011 10.5  4.0-10.5 Final  . RBC (MIL/uL) 06/07/2011 3.67* 3.87-5.11 Final  . Hemoglobin (g/dL) 23/55/7322 02.5* 42.7-06.2 Final  . HCT (%) 06/07/2011 32.7* 36.0-46.0 Final  . MCV (fL) 06/07/2011 89.1  78.0-100.0 Final  . MCH (pg) 06/07/2011 29.7  26.0-34.0 Final  . MCHC (g/dL) 37/62/8315 17.6  16.0-73.7 Final  . RDW (%) 06/07/2011 12.9  11.5-15.5 Final  . Platelets (K/uL) 06/07/2011 189  150-400 Final  .  Sodium (mEq/L) 06/07/2011 136  135-145 Final  . Potassium (mEq/L) 06/07/2011 3.3* 3.5-5.1 Final  . Chloride (mEq/L) 06/07/2011 100  96-112 Final  . CO2 (mEq/L) 06/07/2011 28  19-32 Final  . Glucose, Bld (mg/dL) 16/05/9603 540* 98-11 Final  . BUN (mg/dL) 91/47/8295 9  6-21 Final  . Creatinine, Ser (mg/dL) 30/86/5784 6.96  2.95-2.84 Final  . Calcium (mg/dL) 13/24/4010 9.9  2.7-25.3 Final  . GFR calc non Af Amer (mL/min) 06/07/2011 >90  >90 Final  . GFR calc Af Amer (mL/min) 06/07/2011 >90  >90 Final   Comment:                                 The eGFR has been calculated                          using the CKD EPI equation.                          This calculation has not been                          validated in all clinical                          situations.                           eGFR's persistently                          <90 mL/min signify                          possible Chronic Kidney Disease.  . WBC (K/uL) 06/08/2011 11.9* 4.0-10.5 Final  . RBC (MIL/uL) 06/08/2011 3.71* 3.87-5.11 Final  . Hemoglobin (g/dL) 66/44/0347 42.5* 95.6-38.7 Final  . HCT (%) 06/08/2011 33.3* 36.0-46.0 Final  . MCV (fL) 06/08/2011 89.8  78.0-100.0 Final  . MCH (pg) 06/08/2011 30.2  26.0-34.0 Final  . MCHC (g/dL) 56/43/3295 18.8  41.6-60.6 Final  . RDW (%) 06/08/2011 12.8  11.5-15.5 Final  . Platelets (K/uL) 06/08/2011 212  150-400 Final    X-Rays:Dg Chest 2 View  05/31/2011  *RADIOLOGY REPORT*  Clinical Data: Preoperative respiratory examination.  History of asthma.  CHEST - 2 VIEW  Comparison: 10/03/2008 radiographs.  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  The lungs are otherwise clear.  Heart size and mediastinal contours are stable.  There is no pleural effusion or pneumothorax.  Cholecystectomy clips are noted.  IMPRESSION: Stable examination.  No active cardiopulmonary process.  Original Report Authenticated By: Gerrianne Scale, M.D.    EKG: Orders placed during the hospital encounter of 06/05/11  . EKG  . EKG     Hospital Course: Patient was admitted to [facility] and taken to the OR and underwent the above state procedure without complications.  Patient tolerated the procedure well and was later transferred to the recovery room and then to the orthopaedic floor for postoperative care.  They were given PO and IV analgesics for pain control following their surgery.  They were given 24 hours of postoperative antibiotics and started on DVT  prophylaxis.   PT was ordered postop.  Discharge planning consulted to help with postop disposition and equipment needs.  Patient had a good night on the evening of surgery and started to get up with therapy on day one. Seen in rounds with  Dr. Lequita Halt and found to be doing well. Patient wanted to go home.  Would make arrangements and allow home today if doing well with PT.   Discharge Medications: Prior to Admission medications   Medication Sig Start Date End Date Taking? Authorizing Provider  acetaminophen (TYLENOL) 325 MG tablet Take 650 mg by mouth every 4 (four) hours as needed. Pain     Yes Historical Provider, MD  esomeprazole (NEXIUM) 40 MG capsule Take 40 mg by mouth at bedtime.    Yes Historical Provider, MD  ezetimibe (ZETIA) 10 MG tablet Take 10 mg by mouth every evening.    Yes Historical Provider, MD  magnesium citrate 1.745 GM/30ML SOLN Take 296 mLs by mouth once.     Yes Historical Provider, MD  menthol-cetylpyridinium (CEPACOL) 3 MG lozenge Take 1 lozenge by mouth as needed. Cough    Yes Historical Provider, MD  methocarbamol (ROBAXIN) 500 MG tablet Take 500 mg by mouth every 6 (six) hours as needed. Muscle spasms    Yes Historical Provider, MD  oxycodone (OXY-IR) 5 MG capsule Take 5 mg by mouth every 4 (four) hours as needed. PAIN      Yes Historical Provider, MD  polyethylene glycol (MIRALAX / GLYCOLAX) packet Take 17 g by mouth daily as needed.    Yes Historical Provider, MD  rivaroxaban (XARELTO) 10 MG TABS tablet Take 10 mg by mouth every morning.    Yes Historical Provider, MD  sennosides-docusate sodium (SENOKOT-S) 8.6-50 MG tablet Take 2 tablets by mouth at bedtime.    Yes Historical Provider, MD  traMADol (ULTRAM) 50 MG tablet Take 50 mg by mouth 2 (two) times daily as needed. PAIN     Yes Historical Provider, MD  zolpidem (AMBIEN) 5 MG tablet Take 5 mg by mouth at bedtime as needed. SLEEP    Yes Historical Provider, MD  loratadine (CLARITIN) 10 MG tablet Take 10 mg by mouth daily.      Historical Provider, MD  metoprolol (LOPRESSOR) 100 MG tablet Take 100 mg by mouth at bedtime.     Historical Provider, MD  olmesartan-hydrochlorothiazide (BENICAR HCT) 40-12.5 MG per tablet Take 1 tablet by mouth  every morning.     Historical Provider, MD  potassium chloride SA (K-DUR,KLOR-CON) 20 MEQ tablet Take 40 mEq by mouth daily.      Historical Provider, MD    Diet:general  Activity:WBAT   Follow-up:in 2 weeks  Disposition: Home  Discharged Condition: stable    Current Discharge Medication List    CONTINUE these medications which have NOT CHANGED   Details  acetaminophen (TYLENOL) 325 MG tablet Take 650 mg by mouth every 4 (four) hours as needed. Pain      esomeprazole (NEXIUM) 40 MG capsule Take 40 mg by mouth at bedtime.     ezetimibe (ZETIA) 10 MG tablet Take 10 mg by mouth every evening.     magnesium citrate 1.745 GM/30ML SOLN Take 296 mLs by mouth once.      menthol-cetylpyridinium (CEPACOL) 3 MG lozenge Take 1 lozenge by mouth as needed. Cough     methocarbamol (ROBAXIN) 500 MG tablet Take 500 mg by mouth every 6 (six) hours as needed. Muscle spasms     oxycodone (OXY-IR) 5 MG  capsule Take 5 mg by mouth every 4 (four) hours as needed. PAIN       polyethylene glycol (MIRALAX / GLYCOLAX) packet Take 17 g by mouth daily as needed.     rivaroxaban (XARELTO) 10 MG TABS tablet Take 10 mg by mouth every morning.     sennosides-docusate sodium (SENOKOT-S) 8.6-50 MG tablet Take 2 tablets by mouth at bedtime.     traMADol (ULTRAM) 50 MG tablet Take 50 mg by mouth 2 (two) times daily as needed. PAIN      zolpidem (AMBIEN) 5 MG tablet Take 5 mg by mouth at bedtime as needed. SLEEP     loratadine (CLARITIN) 10 MG tablet Take 10 mg by mouth daily.      metoprolol (LOPRESSOR) 100 MG tablet Take 100 mg by mouth at bedtime.     olmesartan-hydrochlorothiazide (BENICAR HCT) 40-12.5 MG per tablet Take 1 tablet by mouth every morning.     potassium chloride SA (K-DUR,KLOR-CON) 20 MEQ tablet Take 40 mEq by mouth daily.           SignedPatrica Duel 06/27/2011, 7:55 AM

## 2011-06-27 NOTE — Plan of Care (Signed)
Problem: Phase II Progression Outcomes Goal: Foley discontinued Outcome: Completed/Met Date Met:  06/27/11 Foley discontinued at 1030 on 06/27/11

## 2011-06-27 NOTE — Progress Notes (Signed)
Pt provided with discharge instructions and prescriptions. Pt states she understands signs and symptoms that indicate infection and will call the MD if this occurs. Pt discharged in stable condition.

## 2011-07-27 ENCOUNTER — Encounter (HOSPITAL_COMMUNITY): Payer: Self-pay | Admitting: Pharmacy Technician

## 2011-07-27 ENCOUNTER — Other Ambulatory Visit: Payer: Self-pay | Admitting: Orthopedic Surgery

## 2011-07-27 ENCOUNTER — Encounter (HOSPITAL_COMMUNITY): Payer: Self-pay | Admitting: *Deleted

## 2011-07-28 ENCOUNTER — Encounter (HOSPITAL_COMMUNITY)
Admission: RE | Disposition: A | Discharge status: Home / Self Care | Payer: Self-pay | Source: Ambulatory Visit | Attending: Orthopedic Surgery

## 2011-07-28 ENCOUNTER — Encounter (HOSPITAL_COMMUNITY): Payer: Self-pay | Admitting: Anesthesiology

## 2011-07-28 ENCOUNTER — Ambulatory Visit (HOSPITAL_COMMUNITY)
Admission: RE | Admit: 2011-07-28 | Discharge: 2011-07-29 | Disposition: A | Discharge status: Home / Self Care | Payer: Medicare Other | Source: Ambulatory Visit | Attending: Orthopedic Surgery | Admitting: Orthopedic Surgery

## 2011-07-28 ENCOUNTER — Ambulatory Visit (HOSPITAL_COMMUNITY): Payer: Medicare Other | Admitting: Anesthesiology

## 2011-07-28 ENCOUNTER — Encounter (HOSPITAL_COMMUNITY): Payer: Self-pay | Admitting: *Deleted

## 2011-07-28 DIAGNOSIS — Z7982 Long term (current) use of aspirin: Secondary | ICD-10-CM | POA: Insufficient documentation

## 2011-07-28 DIAGNOSIS — J45909 Unspecified asthma, uncomplicated: Secondary | ICD-10-CM | POA: Insufficient documentation

## 2011-07-28 DIAGNOSIS — E7800 Pure hypercholesterolemia, unspecified: Secondary | ICD-10-CM | POA: Insufficient documentation

## 2011-07-28 DIAGNOSIS — K219 Gastro-esophageal reflux disease without esophagitis: Secondary | ICD-10-CM | POA: Insufficient documentation

## 2011-07-28 DIAGNOSIS — Z79899 Other long term (current) drug therapy: Secondary | ICD-10-CM | POA: Insufficient documentation

## 2011-07-28 DIAGNOSIS — I10 Essential (primary) hypertension: Secondary | ICD-10-CM | POA: Insufficient documentation

## 2011-07-28 DIAGNOSIS — M25569 Pain in unspecified knee: Secondary | ICD-10-CM | POA: Insufficient documentation

## 2011-07-28 DIAGNOSIS — S86819A Strain of other muscle(s) and tendon(s) at lower leg level, unspecified leg, initial encounter: Secondary | ICD-10-CM

## 2011-07-28 DIAGNOSIS — Z96659 Presence of unspecified artificial knee joint: Secondary | ICD-10-CM | POA: Insufficient documentation

## 2011-07-28 DIAGNOSIS — M66269 Spontaneous rupture of extensor tendons, unspecified lower leg: Secondary | ICD-10-CM | POA: Insufficient documentation

## 2011-07-28 HISTORY — PX: PATELLAR TENDON REPAIR: SHX737

## 2011-07-28 LAB — SURGICAL PCR SCREEN
MRSA, PCR: NEGATIVE
Staphylococcus aureus: NEGATIVE

## 2011-07-28 LAB — COMPREHENSIVE METABOLIC PANEL
ALT: 18 U/L (ref 0–35)
AST: 21 U/L (ref 0–37)
Albumin: 3.7 g/dL (ref 3.5–5.2)
Alkaline Phosphatase: 58 U/L (ref 39–117)
BUN: 8 mg/dL (ref 6–23)
Chloride: 103 mEq/L (ref 96–112)
Potassium: 2.5 mEq/L — CL (ref 3.5–5.1)
Sodium: 141 mEq/L (ref 135–145)
Total Bilirubin: 0.7 mg/dL (ref 0.3–1.2)

## 2011-07-28 LAB — POTASSIUM: Potassium: 2.5 mEq/L — CL (ref 3.5–5.1)

## 2011-07-28 LAB — CBC
HCT: 37.1 % (ref 36.0–46.0)
RDW: 13.4 % (ref 11.5–15.5)
WBC: 6 10*3/uL (ref 4.0–10.5)

## 2011-07-28 SURGERY — REPAIR, TENDON, PATELLAR
Anesthesia: General | Site: Knee | Laterality: Right | Wound class: Clean

## 2011-07-28 MED ORDER — OXYCODONE HCL 5 MG PO CAPS: 5.0000 mg | ORAL_CAPSULE | ORAL | Status: DC | PRN

## 2011-07-28 MED ORDER — METOCLOPRAMIDE HCL 10 MG PO TABS: 5.0000 mg | ORAL_TABLET | Freq: Three times a day (TID) | ORAL | Status: DC | PRN

## 2011-07-28 MED ORDER — LACTATED RINGERS IV SOLN: INTRAVENOUS | Status: DC

## 2011-07-28 MED ORDER — LORATADINE 10 MG PO TABS
10.0000 mg | ORAL_TABLET | Freq: Every day | ORAL | Status: DC
  Administered 2011-07-28 – 2011-07-29 (×2): 10 mg via ORAL
  Filled 2011-07-28 (×2): qty 1

## 2011-07-28 MED ORDER — OXYCODONE HCL 5 MG PO TABS
5.0000 mg | ORAL_TABLET | ORAL | Status: DC | PRN
  Administered 2011-07-28: 10 mg via ORAL

## 2011-07-28 MED ORDER — METHOCARBAMOL 100 MG/ML IJ SOLN
500.0000 mg | Freq: Four times a day (QID) | INTRAVENOUS | Status: DC | PRN
  Administered 2011-07-28: 500 mg via INTRAVENOUS
  Filled 2011-07-28: qty 5

## 2011-07-28 MED ORDER — VANCOMYCIN HCL IN DEXTROSE 1-5 GM/200ML-% IV SOLN
1000.0000 mg | Freq: Once | INTRAVENOUS | Status: AC
  Administered 2011-07-29: 1000 mg via INTRAVENOUS
  Filled 2011-07-28: qty 200

## 2011-07-28 MED ORDER — VANCOMYCIN HCL 1000 MG IV SOLR
1000.0000 mg | INTRAVENOUS | Status: DC | PRN
  Administered 2011-07-28: 1000 mg via INTRAVENOUS

## 2011-07-28 MED ORDER — METHOCARBAMOL 500 MG PO TABS
500.0000 mg | ORAL_TABLET | Freq: Four times a day (QID) | ORAL | Status: DC | PRN
  Administered 2011-07-29: 500 mg via ORAL
  Filled 2011-07-28: qty 1

## 2011-07-28 MED ORDER — POTASSIUM CHLORIDE IN NACL 20-0.9 MEQ/L-% IV SOLN
INTRAVENOUS | Status: AC
  Filled 2011-07-28: qty 1000

## 2011-07-28 MED ORDER — OXYCODONE HCL 5 MG PO TABS
ORAL_TABLET | ORAL | Status: AC
  Administered 2011-07-29: 5 mg via ORAL
  Filled 2011-07-28: qty 2

## 2011-07-28 MED ORDER — DEXAMETHASONE SODIUM PHOSPHATE 10 MG/ML IJ SOLN
10.0000 mg | Freq: Once | INTRAMUSCULAR | Status: DC
  Filled 2011-07-28: qty 1

## 2011-07-28 MED ORDER — EZETIMIBE 10 MG PO TABS
10.0000 mg | ORAL_TABLET | Freq: Every evening | ORAL | Status: DC
  Administered 2011-07-28: 10 mg via ORAL
  Filled 2011-07-28 (×2): qty 1

## 2011-07-28 MED ORDER — PROPOFOL 10 MG/ML IV BOLUS
INTRAVENOUS | Status: DC | PRN
  Administered 2011-07-28: 200 mg via INTRAVENOUS

## 2011-07-28 MED ORDER — FENTANYL CITRATE 0.05 MG/ML IJ SOLN
25.0000 ug | INTRAMUSCULAR | Status: DC | PRN
  Administered 2011-07-28 (×3): 50 ug via INTRAVENOUS

## 2011-07-28 MED ORDER — MEPERIDINE HCL 25 MG/ML IJ SOLN: 6.2500 mg | INTRAMUSCULAR | Status: DC | PRN

## 2011-07-28 MED ORDER — FENTANYL CITRATE 0.05 MG/ML IJ SOLN
INTRAMUSCULAR | Status: AC
  Filled 2011-07-28: qty 2

## 2011-07-28 MED ORDER — METOPROLOL TARTRATE 50 MG PO TABS
100.0000 mg | ORAL_TABLET | Freq: Every day | ORAL | Status: DC
  Administered 2011-07-28: 100 mg via ORAL
  Filled 2011-07-28 (×2): qty 2

## 2011-07-28 MED ORDER — HYDROMORPHONE HCL PF 1 MG/ML IJ SOLN
INTRAMUSCULAR | Status: DC | PRN
  Administered 2011-07-28: .2 mg via INTRAVENOUS
  Administered 2011-07-28 (×3): .4 mg via INTRAVENOUS
  Administered 2011-07-28: .2 mg via INTRAVENOUS
  Administered 2011-07-28: .4 mg via INTRAVENOUS

## 2011-07-28 MED ORDER — SODIUM CHLORIDE 0.9 % IV SOLN: INTRAVENOUS | Status: DC

## 2011-07-28 MED ORDER — ASPIRIN 81 MG PO CHEW
81.0000 mg | CHEWABLE_TABLET | Freq: Every day | ORAL | Status: DC
  Administered 2011-07-29: 81 mg via ORAL
  Filled 2011-07-28: qty 1

## 2011-07-28 MED ORDER — METHOCARBAMOL 500 MG PO TABS: 500.0000 mg | ORAL_TABLET | Freq: Four times a day (QID) | ORAL | Status: DC | PRN

## 2011-07-28 MED ORDER — OXYCODONE HCL 5 MG PO TABS
5.0000 mg | ORAL_TABLET | ORAL | Status: DC | PRN
  Administered 2011-07-29: 5 mg via ORAL
  Filled 2011-07-28: qty 1

## 2011-07-28 MED ORDER — ONDANSETRON HCL 4 MG/2ML IJ SOLN
INTRAMUSCULAR | Status: DC | PRN
  Administered 2011-07-28: 4 mg via INTRAVENOUS

## 2011-07-28 MED ORDER — ONDANSETRON HCL 4 MG PO TABS: 4.0000 mg | ORAL_TABLET | Freq: Four times a day (QID) | ORAL | Status: DC | PRN

## 2011-07-28 MED ORDER — ZOLPIDEM TARTRATE 5 MG PO TABS: 5.0000 mg | ORAL_TABLET | Freq: Every evening | ORAL | Status: DC | PRN

## 2011-07-28 MED ORDER — TRAMADOL HCL 50 MG PO TABS: 50.0000 mg | ORAL_TABLET | Freq: Two times a day (BID) | ORAL | Status: DC | PRN

## 2011-07-28 MED ORDER — FENTANYL CITRATE 0.05 MG/ML IJ SOLN
INTRAMUSCULAR | Status: DC | PRN
  Administered 2011-07-28 (×5): 50 ug via INTRAVENOUS

## 2011-07-28 MED ORDER — ONDANSETRON HCL 4 MG/2ML IJ SOLN: 4.0000 mg | Freq: Four times a day (QID) | INTRAMUSCULAR | Status: DC | PRN

## 2011-07-28 MED ORDER — VANCOMYCIN HCL IN DEXTROSE 1-5 GM/200ML-% IV SOLN
INTRAVENOUS | Status: AC
  Filled 2011-07-28: qty 200

## 2011-07-28 MED ORDER — ACETAMINOPHEN 10 MG/ML IV SOLN
INTRAVENOUS | Status: AC
  Filled 2011-07-28: qty 100

## 2011-07-28 MED ORDER — CHLORHEXIDINE GLUCONATE 4 % EX LIQD: 60.0000 mL | Freq: Once | CUTANEOUS | Status: DC

## 2011-07-28 MED ORDER — VITAMINS A & D EX OINT
TOPICAL_OINTMENT | CUTANEOUS | Status: AC
  Administered 2011-07-29
  Filled 2011-07-28: qty 5

## 2011-07-28 MED ORDER — LABETALOL HCL 5 MG/ML IV SOLN
INTRAVENOUS | Status: DC | PRN
  Administered 2011-07-28 (×2): 2.5 mg via INTRAVENOUS

## 2011-07-28 MED ORDER — MORPHINE SULFATE 2 MG/ML IJ SOLN
1.0000 mg | INTRAMUSCULAR | Status: DC | PRN
  Administered 2011-07-28 – 2011-07-29 (×2): 2 mg via INTRAVENOUS
  Filled 2011-07-28 (×3): qty 1

## 2011-07-28 MED ORDER — LACTATED RINGERS IV SOLN
INTRAVENOUS | Status: DC
  Administered 2011-07-28: 1000 mL via INTRAVENOUS

## 2011-07-28 MED ORDER — ACETAMINOPHEN 10 MG/ML IV SOLN
1000.0000 mg | Freq: Once | INTRAVENOUS | Status: AC
  Administered 2011-07-28: 1000 mg via INTRAVENOUS
  Filled 2011-07-28: qty 100

## 2011-07-28 MED ORDER — POTASSIUM CHLORIDE IN NACL 20-0.9 MEQ/L-% IV SOLN
INTRAVENOUS | Status: DC
  Administered 2011-07-28: 22:00:00 via INTRAVENOUS
  Filled 2011-07-28 (×3): qty 1000

## 2011-07-28 MED ORDER — POTASSIUM CHLORIDE CRYS ER 20 MEQ PO TBCR
40.0000 meq | EXTENDED_RELEASE_TABLET | Freq: Two times a day (BID) | ORAL | Status: DC
  Administered 2011-07-28 – 2011-07-29 (×2): 40 meq via ORAL
  Filled 2011-07-28 (×3): qty 2

## 2011-07-28 MED ORDER — PANTOPRAZOLE SODIUM 40 MG PO TBEC
80.0000 mg | DELAYED_RELEASE_TABLET | Freq: Every day | ORAL | Status: DC
  Administered 2011-07-28 – 2011-07-29 (×2): 80 mg via ORAL
  Filled 2011-07-28 (×2): qty 2

## 2011-07-28 MED ORDER — LIDOCAINE HCL (CARDIAC) 20 MG/ML IV SOLN
INTRAVENOUS | Status: DC | PRN
  Administered 2011-07-28: 80 mg via INTRAVENOUS

## 2011-07-28 MED ORDER — METOCLOPRAMIDE HCL 5 MG/ML IJ SOLN: 5.0000 mg | Freq: Three times a day (TID) | INTRAMUSCULAR | Status: DC | PRN

## 2011-07-28 MED ORDER — PROMETHAZINE HCL 25 MG/ML IJ SOLN: 6.2500 mg | INTRAMUSCULAR | Status: DC | PRN

## 2011-07-28 SURGICAL SUPPLY — 50 items
BAG SPEC THK2 15X12 ZIP CLS (MISCELLANEOUS) ×1
BAG ZIPLOCK 12X15 (MISCELLANEOUS) ×2 IMPLANT
BANDAGE ELASTIC 6 VELCRO ST LF (GAUZE/BANDAGES/DRESSINGS) ×1 IMPLANT
BANDAGE ESMARK 6X9 LF (GAUZE/BANDAGES/DRESSINGS) ×1 IMPLANT
BANDAGE GAUZE ELAST BULKY 4 IN (GAUZE/BANDAGES/DRESSINGS) ×2 IMPLANT
BIT DRILL 2.0X128 (BIT) ×1 IMPLANT
BIT DRILL 2.8X128 (BIT) ×2 IMPLANT
BNDG CMPR 9X6 STRL LF SNTH (GAUZE/BANDAGES/DRESSINGS) ×1
BNDG COHESIVE 6X5 TAN STRL LF (GAUZE/BANDAGES/DRESSINGS) ×2 IMPLANT
BNDG ESMARK 6X9 LF (GAUZE/BANDAGES/DRESSINGS) ×2
CLOTH BEACON ORANGE TIMEOUT ST (SAFETY) ×2 IMPLANT
DECANTER SPIKE VIAL GLASS SM (MISCELLANEOUS) ×2 IMPLANT
DRAPE U-SHAPE 47X51 STRL (DRAPES) ×2 IMPLANT
DRSG ADAPTIC 3X8 NADH LF (GAUZE/BANDAGES/DRESSINGS) ×1 IMPLANT
DRSG EMULSION OIL 3X16 NADH (GAUZE/BANDAGES/DRESSINGS) ×2 IMPLANT
DRSG PAD ABDOMINAL 8X10 ST (GAUZE/BANDAGES/DRESSINGS) ×3 IMPLANT
DURAPREP 26ML APPLICATOR (WOUND CARE) ×2 IMPLANT
ELECT REM PT RETURN 9FT ADLT (ELECTROSURGICAL) ×2
ELECTRODE REM PT RTRN 9FT ADLT (ELECTROSURGICAL) ×1 IMPLANT
GLOVE BIO SURGEON STRL SZ7.5 (GLOVE) ×2 IMPLANT
GLOVE BIO SURGEON STRL SZ8 (GLOVE) ×2 IMPLANT
GLOVE BIOGEL PI IND STRL 8 (GLOVE) ×2 IMPLANT
GLOVE BIOGEL PI INDICATOR 8 (GLOVE) ×2
GOWN STRL NON-REIN LRG LVL3 (GOWN DISPOSABLE) ×2 IMPLANT
GOWN STRL REIN XL XLG (GOWN DISPOSABLE) ×2 IMPLANT
IMMOBILIZER KNEE 20 (SOFTGOODS) ×2
IMMOBILIZER KNEE 20 THIGH 36 (SOFTGOODS) IMPLANT
MANIFOLD NEPTUNE II (INSTRUMENTS) ×2 IMPLANT
NEEDLE MAYO .5 CIRCLE (NEEDLE) ×1 IMPLANT
PACK TOTAL JOINT (CUSTOM PROCEDURE TRAY) ×2 IMPLANT
PADDING CAST COTTON 6X4 STRL (CAST SUPPLIES) ×2 IMPLANT
PASSER SUT SWANSON 36MM LOOP (INSTRUMENTS) ×2 IMPLANT
POSITIONER SURGICAL ARM (MISCELLANEOUS) ×2 IMPLANT
SPONGE GAUZE 4X4 12PLY (GAUZE/BANDAGES/DRESSINGS) ×2 IMPLANT
SPONGE LAP 18X18 X RAY DECT (DISPOSABLE) ×4 IMPLANT
SPONGE LAP 4X18 X RAY DECT (DISPOSABLE) ×4 IMPLANT
STAPLER SKIN PROX WIDE 3.9 (STAPLE) ×1 IMPLANT
STRIP CLOSURE SKIN 1/2X4 (GAUZE/BANDAGES/DRESSINGS) ×1 IMPLANT
SUT ETHIBOND NAB CT1 #1 30IN (SUTURE) ×4 IMPLANT
SUT FIBERWIRE #2 38 T-5 BLUE (SUTURE) ×4
SUT MNCRL AB 4-0 PS2 18 (SUTURE) ×2 IMPLANT
SUT VIC AB 0 CT1 27 (SUTURE)
SUT VIC AB 0 CT1 27XBRD ANTBC (SUTURE) ×2 IMPLANT
SUT VIC AB 1 CT1 27 (SUTURE)
SUT VIC AB 1 CT1 27XBRD ANTBC (SUTURE) ×1 IMPLANT
SUT VIC AB 2-0 CT1 27 (SUTURE) ×6
SUT VIC AB 2-0 CT1 TAPERPNT 27 (SUTURE) ×1 IMPLANT
SUTURE FIBERWR #2 38 T-5 BLUE (SUTURE) IMPLANT
TOWEL OR 17X26 10 PK STRL BLUE (TOWEL DISPOSABLE) ×4 IMPLANT
WATER STERILE IRR 1500ML POUR (IV SOLUTION) ×2 IMPLANT

## 2011-07-28 NOTE — Brief Op Note (Signed)
07/28/2011  7:11 PM  PATIENT:  Nancy Sosa  70 y.o. female  PRE-OPERATIVE DIAGNOSIS:  Right Patellar Tendon Rupture  POST-OPERATIVE DIAGNOSIS:  right patellar tendon repair  PROCEDURE:  Procedure(s): PATELLA TENDON REPAIR RIGHT  SURGEON:  Surgeon(s): Gus Rankin Angelisse Riso  PHYSICIAN ASSISTANT:   ASSISTANTS: [name], PA-C   ANESTHESIA:   general  EBL:  minimal    BLOOD ADMINISTERED:none  DRAINS: none   LOCAL MEDICATIONS USED:  NONE  SPECIMEN:  No Specimen  DISPOSITION OF SPECIMEN:  N/A  COUNTS:  YES  TOURNIQUET:   Total Tourniquet Time Documented: Thigh (Right) - 37 minutes  DICTATION: .Other Dictation: Dictation Number [id number]  PLAN OF CARE: Admit for overnight observation  PATIENT DISPOSITION:  PACU - hemodynamically stable.   Gus Rankin Adela Esteban, MD    07/28/2011, 7:12 PM

## 2011-07-28 NOTE — Anesthesia Preprocedure Evaluation (Addendum)
Anesthesia Evaluation  Patient identified by MRN, date of birth, ID band Patient awake    Reviewed: Allergy & Precautions, H&P , NPO status , Patient's Chart, lab work & pertinent test results  Airway Mallampati: I TM Distance: >3 FB Neck ROM: Full    Dental  (+) Teeth Intact   Pulmonary neg pulmonary ROS, asthma ,  clear to auscultation        Cardiovascular hypertension, Pt. on medications neg cardio ROS - dysrhythmias Regular     Neuro/Psych Negative Neurological ROS  Negative Psych ROS   GI/Hepatic negative GI ROS, Neg liver ROS, GERD-  Medicated and Controlled,  Endo/Other  Negative Endocrine ROS  Renal/GU negative Renal ROS  Genitourinary negative   Musculoskeletal negative musculoskeletal ROS (+)   Abdominal   Peds negative pediatric ROS (+)  Hematology negative hematology ROS (+)   Anesthesia Other Findings   Reproductive/Obstetrics negative OB ROS                          Anesthesia Physical Anesthesia Plan  ASA: II  Anesthesia Plan: General   Post-op Pain Management:    Induction: Intravenous  Airway Management Planned: LMA  Additional Equipment:   Intra-op Plan:   Post-operative Plan: Extubation in OR  Informed Consent: I have reviewed the patients History and Physical, chart, labs and discussed the procedure including the risks, benefits and alternatives for the proposed anesthesia with the patient or authorized representative who has indicated his/her understanding and acceptance.   Dental advisory given  Plan Discussed with: CRNA  Anesthesia Plan Comments:         Anesthesia Quick Evaluation

## 2011-07-28 NOTE — H&P (Signed)
[name] MRN: [id number] DOB/AGE: 03/22/1941 70 y.o.  Admit date: 07/28/11  Chief Complaint:  right knee pain.  Subjective: Patient is admitted for right patella tendon repair  Patient is a 70 y.o. female who has been seen by Dr. Lequita Halt for ongoing hip pain.  They have been followed and found to have continued progressive pain.  They have been treated conservatively in the past including medications.  Despite conservative measures, they continue to have pain. She underwent a total knee replacement back in October of this year.  She unfortunately during her rehab of her total knee felt her leg give way and now found to have an extension lag of 30 degrees but still has good active flexion.  It is felt that due to her patella tendon rupture, failure of the extensor mechanism, she will require surgical fixation. Risks and benefits have been discussed with the patient and they elect to proceed with surgery.  The patient has no contraindications to the upcoming procedure such as ongoing infection or progressive neurological disease.  Allergies: Allergies  Allergen Reactions  . Bactrim (Sulfamethoxazole-Trimethoprim)   . Penicillins   . Statins   . Sulfa Drugs Cross Reactors      Medications: No prescriptions prior to admission    Past Medical History: Past Medical History  Diagnosis Date  . Arthritis   . Asthma     also seasonal allergies/bronchitis  . GERD (gastroesophageal reflux disease)   . Barrett's esophagus   . Hypercholesterolemia   . Cataracts, bilateral   . Varicose veins   . Bursitis of shoulder   . S/P left knee arthroscopy   . S/P right knee arthroscopy   . Hypertension     pt was on benicar hct and metoprolol--but after her knee replacement surg oct 2012--while in rehab--her b/p stayed so low that the benicar hct was d/c--she still takes the metoprolol  . Dysrhythmia     chronic tachycardia.  nuclear stress test 03/31/11 for preop clearance for TKA  --report on this chart from dr. Crawford Givens ischemia, occas pvc,.     Past Surgical History: Past Surgical History  Procedure Date  . Tonsillectomy   . Knee arthroscopy     bilateral  . Fibroids hand removed   . Fibroids breast removed   . Abdominal hysterectomy   . Cholecystectomy   . Patellar tendon repair 06/26/2011    Procedure: PATELLA TENDON REPAIR;  Surgeon: Loanne Drilling;  Location: [facility];  Service: Orthopedics;  Laterality: Right;  Repair of Extensor Mechanisim  . Fracture surgery     rt wrist orif  . Joint replacement 06/05/11    right total knee arthroplasty  . Left foot bunionectomy      Family History: History reviewed. No pertinent family history.  Social History: History  Substance Use Topics  . Smoking status: Never Smoker   . Smokeless tobacco: Not on file  . Alcohol Use: No     Review of Systems Constitutional: negative Respiratory: negative Cardiovascular: negative Gastrointestinal: negative Genitourinary:negative Musculoskeletal:positive for joint pain and weakness  Physical Exam:  General: alert, cooperative and mildly obese HENT:Head: Normocephalic, no lesions, without obvious abnormality. Eye: Pupils round and reactive Neck / Thyroid: supple without significant adenopathy Neck:supple Chest:clear to auscultation, no wheezes, rales or rhonchi, symmetric air entry Heart:S1, S2 normal, no murmur, rub or gallop, regular rate and rhythm Abdomen:abdomen soft and normal bowel sounds Rectal/Breast/Genitalia: Not done, not pertinent to present illness. Musculoskeletal:Neurovascular intact Sensation intact distally Previous incision  healed, good active flexion greater than 110, but has about 30 degrees of extension lag.  LABS: Results for orders placed during the hospital encounter of 06/26/11  APTT      Component Value Range   aPTT 34  24 - 37 (seconds)  CBC      Component Value Range   WBC 5.4  4.0 - 10.5 (K/uL)   RBC 3.95  3.87 - 5.11  (MIL/uL)   Hemoglobin 11.9 (*) 12.0 - 15.0 (g/dL)   HCT 16.1  09.6 - 04.5 (%)   MCV 92.4  78.0 - 100.0 (fL)   MCH 30.1  26.0 - 34.0 (pg)   MCHC 32.6  30.0 - 36.0 (g/dL)   RDW 40.9  81.1 - 91.4 (%)   Platelets 385  150 - 400 (K/uL)  COMPREHENSIVE METABOLIC PANEL      Component Value Range   Sodium 135  135 - 145 (mEq/L)   Potassium 3.6  3.5 - 5.1 (mEq/L)   Chloride 100  96 - 112 (mEq/L)   CO2 24  19 - 32 (mEq/L)   Glucose, Bld 84  70 - 99 (mg/dL)   BUN 12  6 - 23 (mg/dL)   Creatinine, Ser 7.82  0.50 - 1.10 (mg/dL)   Calcium 95.6 (*) 8.4 - 10.5 (mg/dL)   Total Protein 7.0  6.0 - 8.3 (g/dL)   Albumin 3.7  3.5 - 5.2 (g/dL)   AST 32  0 - 37 (U/L)   ALT 35  0 - 35 (U/L)   Alkaline Phosphatase 63  39 - 117 (U/L)   Total Bilirubin 0.4  0.3 - 1.2 (mg/dL)   GFR calc non Af Amer 89 (*) >90 (mL/min)   GFR calc Af Amer >90  >90 (mL/min)  PROTIME-INR      Component Value Range   Prothrombin Time 15.6 (*) 11.6 - 15.2 (seconds)   INR 1.21  0.00 - 1.49   TYPE AND SCREEN      Component Value Range   ABO/RH(D) A POS     Antibody Screen NEG     Sample Expiration 06/29/2011    SURGICAL PCR SCREEN      Component Value Range   MRSA, PCR NEGATIVE  NEGATIVE    Staphylococcus aureus NEGATIVE  NEGATIVE    Assessment/Plan: Right Knee Patella Tendon Rupture  Patient is for a repair of the right knee patella tendon rupture.  Surgery will be performed by Dr. Ollen Gross.  Risks and benefits have been discussed with the patient and they elect to proceed wth the procedure.

## 2011-07-28 NOTE — Preoperative (Signed)
Beta Blockers   Reason not to administer Beta Blockers:Concurrent use of intravenous inotropic medications during the perioperative 

## 2011-07-28 NOTE — Progress Notes (Signed)
Received critical value from lab of potassium of 2.5. Paged Dr Acey Lav. Verbal order received to repeat K+. Pauline Aus RN

## 2011-07-28 NOTE — Transfer of Care (Signed)
Immediate Anesthesia Transfer of Care Note  Patient: Nancy Sosa  Procedure(s) Performed:  PATELLA TENDON REPAIR  Patient Location: PACU  Anesthesia Type: General  Level of Consciousness: awake, alert , oriented and patient cooperative  Airway & Oxygen Therapy: Patient Spontanous Breathing and Patient connected to face mask oxygen  Post-op Assessment: Report given to PACU RN and Post -op Vital signs reviewed and stable  Post vital signs: Reviewed and stable  Complications: No apparent anesthesia complications

## 2011-07-28 NOTE — Interval H&P Note (Signed)
History and Physical Interval Note:  07/28/2011 4:54 PM  Nancy Sosa  has presented today for surgery, with the diagnosis of Right Patellar Tendon Rupture  The various methods of treatment have been discussed with the patient and family. After consideration of risks, benefits and other options for treatment, the patient has consented to  Procedure(s): PATELLA TENDON REPAIR as a surgical intervention .  The patients' history has been reviewed, patient examined, no change in status, stable for surgery.  I have reviewed the patients' chart and labs.  Questions were answered to the patient's satisfaction.     Taleigh Gero V   

## 2011-07-28 NOTE — Interval H&P Note (Signed)
History and Physical Interval Note:  07/28/2011 4:54 PM  Nancy Sosa Nancy Sosa  has presented today for surgery, with the diagnosis of Right Patellar Tendon Rupture  The various methods of treatment have been discussed with the patient and family. After consideration of risks, benefits and other options for treatment, the patient has consented to  Procedure(s): PATELLA TENDON REPAIR as a surgical intervention .  The patients' history has been reviewed, patient examined, no change in status, stable for surgery.  I have reviewed the patients' chart and labs.  Questions were answered to the patient's satisfaction.     Loanne Drilling

## 2011-07-28 NOTE — Progress Notes (Signed)
Spoke with dr Acey Lav re: pvc's, occ tachycardia and k+ of 2.5, he states this is chronic and does not want to treat

## 2011-07-29 LAB — BASIC METABOLIC PANEL
CO2: 28 mEq/L (ref 19–32)
Calcium: 9.1 mg/dL (ref 8.4–10.5)
Chloride: 104 mEq/L (ref 96–112)
Creatinine, Ser: 0.76 mg/dL (ref 0.50–1.10)
Glucose, Bld: 101 mg/dL — ABNORMAL HIGH (ref 70–99)

## 2011-07-29 NOTE — Progress Notes (Signed)
OT Screen Order received, chart reviewed. Spoke at length with pt who stated that this was her 3rd knee sx & was very aware of what to expect upon d/c. Provided pt with info on different medical supply stores & info on different tub seats, benches, toilet risers, etc. (Pt does not like the ones that she already has at home...)  Pt will have necessary level of A from family upon d/c. No f/u OT needed. Will sign off.  Garrel Ridgel, OTR/L  Pager [phone] 07/29/2011

## 2011-07-29 NOTE — Progress Notes (Signed)
Orthopedics Progress Note  Subjective: Minimal soreness to right knee but overall doing well. Ready to go home  Objective:  Filed Vitals:   07/29/11 0445  BP: 99/64  Pulse: 75  Temp: 97.8 F (36.6 C)  Resp: 18    General: Awake and alert  Musculoskeletal: Dressing intact to right knee, nv intact distally with no edema Neurovascularly intact  Lab Results  Component Value Date   WBC 6.0 07/28/2011   HGB 12.3 07/28/2011   HCT 37.1 07/28/2011   MCV 87.7 07/28/2011   PLT 249 07/28/2011       Component Value Date/Time   NA 141 07/29/2011 0452   K 3.0* 07/29/2011 0452   CL 104 07/29/2011 0452   CO2 28 07/29/2011 0452   GLUCOSE 101* 07/29/2011 0452   BUN 10 07/29/2011 0452   CREATININE 0.76 07/29/2011 0452   CALCIUM 9.1 07/29/2011 0452   GFRNONAA 83* 07/29/2011 0452   GFRAA >90 07/29/2011 0452    Lab Results  Component Value Date   INR 1.21 06/26/2011   INR 1.03 05/31/2011    Assessment/Plan: POD #1 s/p Procedure(s): PATELLA TENDON REPAIR  Plan: d/c home with knee immobilizer on at all times. Follow up with [facility] in 2 weeks  Almedia Balls. Ranell Patrick, MD 07/29/2011 8:21 AM

## 2011-07-29 NOTE — Op Note (Signed)
NAMESHIZUE, KASEMAN NO.:  [id number]  MEDICAL RECORD NO.:  [id number]  LOCATION:  1618                         FACILITY:  [facility]  PHYSICIAN:  Ollen Gross, M.D.    DATE OF BIRTH:  1941-02-20  DATE OF PROCEDURE:  07/28/2011 DATE OF DISCHARGE:                              OPERATIVE REPORT   PREOPERATIVE DIAGNOSIS:  Right patellar tendon rupture.  POSTOPERATIVE DIAGNOSIS:  Right patellar tendon rupture.  PROCEDURE:  Right patellar tendon repair.  SURGEON:  Ollen Gross, M.D.  ASSISTANT:  Alexzandrew L. Perkins, P.A.C.  ANESTHESIA:  General.  ESTIMATED BLOOD LOSS:  Minimal.  DRAINS:  None.  COMPLICATIONS:  None.  CONDITION:  Stable to Recovery.  BRIEF CLINICAL NOTE:  Nancy Sosa is a 70 year old female who had a right total knee arthroplasty performed about a month ago.  Yesterday, she felt her knee lock and then she fell and felt a pop in the knee.  She was unable to extend her knee since.  I saw her in the office yesterday, felt she had patellar tendon rupture based on exam and based on x-ray. She presents now for patellar tendon repair.  PROCEDURE IN DETAIL:  After successful administration of general anesthetic, a tourniquet was placed on her right thigh and right lower extremity was prepped and draped in the usual sterile fashion. Extremity was wrapped in an Esmarch, tourniquet inflated to 300 mmHg. Previous midline incision was made with a 10 blade through subcutaneous tissue to the extensor mechanism.  There was a transverse rupture right about the midsubstance of the tendon.  There was a fair amount of hypertrophic tissue overlying the tendon.  I elevated this tissue to create medial and lateral flaps, so we can close over the tendon. Tendon was identified.  Two #1 FiberWire sutures were placed in [place]- type fashion going from the tibial tubercle up the medial and lateral halves of the tendon up to where it was torn.  We then used 1  suture going from the inferior pole of the patella through the tendon down to where it was torn superiorly.  The inner aspect of both the inferior sutures was then passed through the inferior pole of the patella and the other suture that started superior to inferior was then passed into the tendon at the level of the tubercle.  These were tied together and then tied to each other.  This effectively pulled the patella down to where it normally should reside in the trochlea.  We then used the other edges of the suture to sew up the sides of the tendon and those were tied to each other.  This was felt to be a very stable repair.  I flexed the knee to 90 and there was no gapping at all.  #1 Ethibonds were then used to close the medial and lateral retinacular tears.  Prior to this, the wound had been copiously irrigated with saline solution as had the joint.  Once the repair was completed, the tourniquet was released, total time of 32 minutes.  Subcu was closed with interrupted 2-0 Vicryl and skin with staples.  The incision was cleaned and dried and a bulky sterile dressing  applied.  She was then placed into a knee immobilizer, awakened, and transported to Recovery in stable condition.  Please note that a surgical assistant was a medical necessity for this procedure in order to perform it in a safe and expeditious manner. Surgical assistant was necessary for help with the weave-type suture to prevent the needles from perforating the suture and rupturing the repair.  Also, necessary for proper positioning of the patella while tying the sutures as well as for primary wound closure.     Ollen Gross, M.D.     FA/MEDQ  D:  07/28/2011  T:  07/29/2011  Job:  [id number]

## 2011-07-29 NOTE — Progress Notes (Signed)
Physical Therapy Evaluation Patient Details Name: Nancy Sosa MRN: [id number] DOB: 28-Mar-1941 Today's Date: 07/29/2011 [id number] eval 2 Problem List: There is no problem list on file for this patient.   Past Medical History:  Past Medical History  Diagnosis Date  . Arthritis   . Asthma     also seasonal allergies/bronchitis  . GERD (gastroesophageal reflux disease)   . Barrett's esophagus   . Hypercholesterolemia   . Cataracts, bilateral   . Varicose veins   . Bursitis of shoulder   . S/P left knee arthroscopy   . S/P right knee arthroscopy   . Hypertension     pt was on benicar hct and metoprolol--but after her knee replacement surg oct 2012--while in rehab--her b/p stayed so low that the benicar hct was d/c--she still takes the metoprolol  . Dysrhythmia     chronic tachycardia.  nuclear stress test 03/31/11 for preop clearance for TKA --report on this chart from dr. Crawford Givens ischemia, occas pvc,.   Past Surgical History:  Past Surgical History  Procedure Date  . Tonsillectomy   . Knee arthroscopy     bilateral  . Fibroids hand removed   . Fibroids breast removed   . Abdominal hysterectomy   . Cholecystectomy   . Patellar tendon repair 06/26/2011    Procedure: PATELLA TENDON REPAIR;  Surgeon: Loanne Drilling;  Location: [facility];  Service: Orthopedics;  Laterality: Right;  Repair of Extensor Mechanisim  . Fracture surgery     rt wrist orif  . Joint replacement 06/05/11    right total knee arthroplasty  . Left foot bunionectomy     PT Assessment/Plan/Recommendation PT Assessment Clinical Impression Statement: one time eval completed.  Pt ready for D/C. discussed stairs and pt declines practice 2* having done it so many times before; pt describes different ways of doing stairs and does so correctly PT Recommendation/Assessment: All further PT needs can be met in the next venue of care PT Recommendation Follow Up Recommendations: Home health PT PT Goals     PT  Evaluation Precautions/Restrictions  Precautions Precautions: Other (comment) Precaution Comments: NO ROM knee Required Braces or Orthoses: Yes Knee Immobilizer: On at all times Restrictions Weight Bearing Restrictions: No Prior Functioning  Home Living Lives With: Alone Receives Help From: Friend(s) Type of Home: House Home Layout: One level Home Access: Stairs to enter Entrance Stairs-Rails: Right Entrance Stairs-Number of Steps: 3 Home Adaptive Equipment: Walker - rolling;Crutches Prior Function Level of Independence: Requires assistive device for independence Able to Take Stairs?: Yes Cognition Cognition Arousal/Alertness: Awake/alert Overall Cognitive Status: Appears within functional limits for tasks assessed Orientation Level: Oriented X4 Sensation/Coordination   Extremity Assessment RUE Assessment RUE Assessment: Within Functional Limits LUE Assessment LUE Assessment: Within Functional Limits RLE Assessment RLE Assessment: Not tested LLE Assessment LLE Assessment: Within Functional Limits Mobility (including Balance) Bed Mobility Bed Mobility: Yes Supine to Sit: 4: Min assist Supine to Sit Details (indicate cue type and reason): with RLE Transfers Transfers: Yes Sit to Stand: 5: Supervision Sit to Stand Details (indicate cue type and reason): cue sfor hands Stand to Sit: 5: Supervision Stand to Sit Details: cues for hands Ambulation/Gait Ambulation/Gait: Yes Ambulation/Gait Assistance:  (min/guard) Ambulation Distance (Feet): 100 Feet Assistive device: Rolling walker Gait Pattern: Step-through pattern (cues for sequence, step length and RW distance from self)    Exercise    End of Session PT - End of Session Equipment Utilized During Treatment: Gait belt Activity Tolerance: Patient tolerated treatment well Patient left:  in chair;with call bell in reach Nurse Communication:  (ready for D/C from PT standpoint) General Behavior During Session: [facility]  for tasks performed Cognition: [facility] for tasks performed  [facility] 07/29/2011, 9:51 AM

## 2011-07-30 NOTE — Anesthesia Postprocedure Evaluation (Signed)
  Anesthesia Post-op Note  Patient: Nancy Sosa  Procedure(s) Performed:  PATELLA TENDON REPAIR  Patient Location: PACU  Anesthesia Type: General  Level of Consciousness: awake and alert   Airway and Oxygen Therapy: Patient Spontanous Breathing  Post-op Pain: mild  Post-op Assessment: Post-op Vital signs reviewed, Patient's Cardiovascular Status Stable, Respiratory Function Stable, Patent Airway and No signs of Nausea or vomiting  Post-op Vital Signs: stable  Complications: No apparent anesthesia complications

## 2011-08-02 ENCOUNTER — Encounter (HOSPITAL_COMMUNITY): Payer: Self-pay | Admitting: Orthopedic Surgery

## 2011-08-02 NOTE — Discharge Summary (Signed)
Physician Discharge Summary  [name] MRN: [id number] DOB/AGE: November 11, 1940 70 y.o.  Admit date: 07/28/2011 Discharge date: 08/02/2011  Admission Diagnoses:  Right patellar tendon re-rupture  Discharge Diagnoses:  Same   Surgeries: Procedure(s): PATELLA TENDON REPAIR on 07/28/2011   Consultants: PT  Discharged Condition: Stable  Hospital Course: Nancy Sosa is an 70 y.o. female who was admitted 07/28/2011 with a chief complaint of No chief complaint on file. , and found to have a diagnosis of <principal problem not specified>.  They were brought to the operating room on 07/28/2011 and underwent the above named procedures.    The patient had an uncomplicated hospital course and was stable for discharge.  Recent vital signs:  Filed Vitals:   07/29/11 0445  BP: 99/64  Pulse: 75  Temp: 97.8 F (36.6 C)  Resp: 18    Recent laboratory studies:  Results for orders placed during the hospital encounter of 07/28/11  SURGICAL PCR SCREEN      Component Value Range   MRSA, PCR NEGATIVE  NEGATIVE    Staphylococcus aureus NEGATIVE  NEGATIVE   CBC      Component Value Range   WBC 6.0  4.0 - 10.5 (K/uL)   RBC 4.23  3.87 - 5.11 (MIL/uL)   Hemoglobin 12.3  12.0 - 15.0 (g/dL)   HCT 86.5  78.4 - 69.6 (%)   MCV 87.7  78.0 - 100.0 (fL)   MCH 29.1  26.0 - 34.0 (pg)   MCHC 33.2  30.0 - 36.0 (g/dL)   RDW 29.5  28.4 - 13.2 (%)   Platelets 249  150 - 400 (K/uL)  COMPREHENSIVE METABOLIC PANEL      Component Value Range   Sodium 141  135 - 145 (mEq/L)   Potassium 2.5 (*) 3.5 - 5.1 (mEq/L)   Chloride 103  96 - 112 (mEq/L)   CO2 27  19 - 32 (mEq/L)   Glucose, Bld 89  70 - 99 (mg/dL)   BUN 8  6 - 23 (mg/dL)   Creatinine, Ser 4.40 (*) 0.50 - 1.10 (mg/dL)   Calcium 10.2  8.4 - 10.5 (mg/dL)   Total Protein 6.8  6.0 - 8.3 (g/dL)   Albumin 3.7  3.5 - 5.2 (g/dL)   AST 21  0 - 37 (U/L)   ALT 18  0 - 35 (U/L)   Alkaline Phosphatase 58  39 - 117 (U/L)   Total Bilirubin 0.7   0.3 - 1.2 (mg/dL)   GFR calc non Af Amer >90  >90 (mL/min)   GFR calc Af Amer >90  >90 (mL/min)  POTASSIUM      Component Value Range   Potassium 2.5 (*) 3.5 - 5.1 (mEq/L)  BASIC METABOLIC PANEL      Component Value Range   Sodium 141  135 - 145 (mEq/L)   Potassium 3.0 (*) 3.5 - 5.1 (mEq/L)   Chloride 104  96 - 112 (mEq/L)   CO2 28  19 - 32 (mEq/L)   Glucose, Bld 101 (*) 70 - 99 (mg/dL)   BUN 10  6 - 23 (mg/dL)   Creatinine, Ser 7.25  0.50 - 1.10 (mg/dL)   Calcium 9.1  8.4 - 36.6 (mg/dL)   GFR calc non Af Amer 83 (*) >90 (mL/min)   GFR calc Af Amer >90  >90 (mL/min)    Discharge Medications:   Discharge Medication List as of 07/29/2011 12:16 PM    CONTINUE these medications which have NOT CHANGED  Details  acetaminophen (TYLENOL) 325 MG tablet Take 650 mg by mouth every 4 (four) hours as needed. Pain , Until Discontinued, Historical Med    aspirin 81 MG tablet Take 81 mg by mouth daily.  , Until Discontinued, Historical Med    cholecalciferol (VITAMIN D) 1000 UNITS tablet Take 1,000 Units by mouth daily. , Until Discontinued, Historical Med    esomeprazole (NEXIUM) 40 MG capsule Take 40 mg by mouth at bedtime. , Until Discontinued, Historical Med    ezetimibe (ZETIA) 10 MG tablet Take 10 mg by mouth every evening. , Until Discontinued, Historical Med    methocarbamol (ROBAXIN) 500 MG tablet Take 1 tablet (500 mg total) by mouth every 6 (six) hours as needed. Muscle spasms , Starting 06/27/2011, Until Discontinued, Print    metoprolol (LOPRESSOR) 100 MG tablet Take 100 mg by mouth at bedtime. , Until Discontinued, Historical Med    oxycodone (OXY-IR) 5 MG capsule Take 1 capsule (5 mg total) by mouth every 4 (four) hours as needed. PAIN  , Starting 06/27/2011, Until Discontinued, Print    loratadine (CLARITIN) 10 MG tablet Take 10 mg by mouth daily. , Until Discontinued, Historical Med    traMADol (ULTRAM) 50 MG tablet Take 50 mg by mouth 2 (two) times daily as needed.  PAIN , Until Discontinued, Historical Med    zolpidem (AMBIEN) 5 MG tablet Take 5 mg by mouth at bedtime as needed. SLEEP, Until Discontinued, Historical Med        Diagnostic Studies: No results found.  Disposition: Home or Self Care  Discharge Orders    Future Orders Please Complete By Expires   Diet - low sodium heart healthy      Call MD / Call 911      Comments:   If you experience chest pain or shortness of breath, CALL 911 and be transported to the hospital emergency room.  If you develope a fever above 101 F, pus (white drainage) or increased drainage or redness at the wound, or calf pain, call your surgeon's office.   Constipation Prevention      Comments:   Drink plenty of fluids.  Prune juice may be helpful.  You may use a stool softener, such as Colace (over the counter) 100 mg twice a day.  Use MiraLax (over the counter) for constipation as needed.   Increase activity slowly as tolerated      Weight Bearing as taught in Physical Therapy      Comments:   Use a walker or crutches as instructed.   Discharge instructions      Comments:   Knee  Immobilizer to stay in place at all times. Follow up with [facility] in 2 weeks   Call MD / Call 911      Comments:   If you experience chest pain or shortness of breath, CALL 911 and be transported to the hospital emergency room.  If you develope a fever above 101 F, pus (white drainage) or increased drainage or redness at the wound, or calf pain, call your surgeon's office.   Constipation Prevention      Comments:   Drink plenty of fluids.  Prune juice may be helpful.  You may use a stool softener, such as Colace (over the counter) 100 mg twice a day.  Use MiraLax (over the counter) for constipation as needed.   Increase activity slowly as tolerated      Weight Bearing as taught in Physical Therapy  Comments:   Use a walker or crutches as instructed.   Discharge instructions      Comments:   Keep your knee  immobilizer on at all times  Take an 81 mg Aspirin daily  Do Not bend your right knee      Follow-up Information    Follow up with ALUISIO,FRANK V. Make an appointment in 2 weeks. (call Wednesday to make appointment)    Contact information:   [facility] [address] [place] [address] [phone]           Signed: Thea Gist 08/02/2011, 8:34 AM

## 2011-08-09 ENCOUNTER — Other Ambulatory Visit: Payer: Self-pay | Admitting: Internal Medicine

## 2011-08-09 DIAGNOSIS — Z1231 Encounter for screening mammogram for malignant neoplasm of breast: Secondary | ICD-10-CM

## 2011-08-22 MED FILL — Mupirocin Oint 2%: CUTANEOUS | Qty: 22 | Status: AC

## 2011-09-25 ENCOUNTER — Ambulatory Visit
Admission: RE | Admit: 2011-09-25 | Discharge: 2011-09-25 | Disposition: A | Discharge status: Home / Self Care | Payer: Medicare Other | Source: Ambulatory Visit | Attending: Internal Medicine | Admitting: Internal Medicine

## 2011-09-25 DIAGNOSIS — Z1231 Encounter for screening mammogram for malignant neoplasm of breast: Secondary | ICD-10-CM

## 2012-09-24 ENCOUNTER — Other Ambulatory Visit: Payer: Self-pay | Admitting: Internal Medicine

## 2012-09-24 DIAGNOSIS — Z1231 Encounter for screening mammogram for malignant neoplasm of breast: Secondary | ICD-10-CM

## 2012-10-11 ENCOUNTER — Ambulatory Visit: Payer: Medicare Other

## 2012-11-05 ENCOUNTER — Ambulatory Visit
Admission: RE | Admit: 2012-11-05 | Discharge: 2012-11-05 | Disposition: A | Discharge status: Home / Self Care | Payer: Medicare Other | Source: Ambulatory Visit | Attending: Internal Medicine | Admitting: Internal Medicine

## 2012-11-05 DIAGNOSIS — Z1231 Encounter for screening mammogram for malignant neoplasm of breast: Secondary | ICD-10-CM

## 2013-09-30 ENCOUNTER — Other Ambulatory Visit: Payer: Self-pay | Admitting: Internal Medicine

## 2013-09-30 DIAGNOSIS — R319 Hematuria, unspecified: Secondary | ICD-10-CM

## 2013-10-03 ENCOUNTER — Ambulatory Visit
Admission: RE | Admit: 2013-10-03 | Discharge: 2013-10-03 | Disposition: A | Discharge status: Home / Self Care | Payer: Medicare Other | Source: Ambulatory Visit | Attending: Internal Medicine | Admitting: Internal Medicine

## 2013-10-03 DIAGNOSIS — R319 Hematuria, unspecified: Secondary | ICD-10-CM

## 2013-10-03 MED ORDER — IOHEXOL 300 MG/ML  SOLN
125.0000 mL | Freq: Once | INTRAMUSCULAR | Status: AC | PRN
  Administered 2013-10-03: 125 mL via INTRAVENOUS

## 2013-10-09 ENCOUNTER — Other Ambulatory Visit: Payer: Self-pay

## 2013-10-09 DIAGNOSIS — Z1231 Encounter for screening mammogram for malignant neoplasm of breast: Secondary | ICD-10-CM

## 2013-11-07 ENCOUNTER — Ambulatory Visit
Admission: RE | Admit: 2013-11-07 | Discharge: 2013-11-07 | Disposition: A | Discharge status: Home / Self Care | Payer: Medicare Other | Source: Ambulatory Visit

## 2013-11-07 DIAGNOSIS — Z1231 Encounter for screening mammogram for malignant neoplasm of breast: Secondary | ICD-10-CM

## 2014-01-23 ENCOUNTER — Other Ambulatory Visit: Payer: Self-pay | Admitting: Rheumatology

## 2014-01-23 DIAGNOSIS — R52 Pain, unspecified: Secondary | ICD-10-CM

## 2014-02-03 ENCOUNTER — Ambulatory Visit
Admission: RE | Admit: 2014-02-03 | Discharge: 2014-02-03 | Disposition: A | Discharge status: Home / Self Care | Payer: Medicare Other | Source: Ambulatory Visit | Attending: Rheumatology | Admitting: Rheumatology

## 2014-02-03 DIAGNOSIS — R52 Pain, unspecified: Secondary | ICD-10-CM

## 2014-02-03 MED ORDER — GADOBENATE DIMEGLUMINE 529 MG/ML IV SOLN
20.0000 mL | Freq: Once | INTRAVENOUS | Status: AC | PRN
  Administered 2014-02-03: 20 mL via INTRAVENOUS

## 2014-02-25 ENCOUNTER — Encounter (INDEPENDENT_AMBULATORY_CARE_PROVIDER_SITE_OTHER): Payer: Self-pay | Admitting: General Surgery

## 2014-02-25 ENCOUNTER — Ambulatory Visit (INDEPENDENT_AMBULATORY_CARE_PROVIDER_SITE_OTHER): Payer: Medicare Other | Admitting: General Surgery

## 2014-02-25 VITALS — BP 136/76 | HR 85 | Temp 97.0°F | Ht 65.0 in | Wt 225.0 lb

## 2014-02-25 DIAGNOSIS — M3320 Polymyositis, organ involvement unspecified: Secondary | ICD-10-CM | POA: Insufficient documentation

## 2014-02-25 NOTE — Progress Notes (Signed)
[name], female   DOB: Apr 12, 1941, 73 y.o.   MRN: [id number]  Chief Complaint  Patient presents with  . eval left thigh    HPI Nancy Sosa is a 73 y.o. female.   HPI  She is referred by Dr. Azzie Roup because of lower tremor and he weakness, elevated CPK levels, an MRI of left thigh suggesting polymyositis.  She is scheduled to have a left knee replacement in September. She does walk with a cane but this is because of of her degenerative joint disease of the left knee.  Past Medical History  Diagnosis Date  . Arthritis   . Asthma     also seasonal allergies/bronchitis  . GERD (gastroesophageal reflux disease)   . Barrett's esophagus   . Hypercholesterolemia   . Cataracts, bilateral   . Varicose veins   . Bursitis of shoulder   . S/P left knee arthroscopy   . S/P right knee arthroscopy   . Hypertension     pt was on benicar hct and metoprolol--but after her knee replacement surg oct 2012--while in rehab--her b/p stayed so low that the benicar hct was d/c--she still takes the metoprolol  . Dysrhythmia     chronic tachycardia.  nuclear stress test 03/31/11 for preop clearance for TKA --report on this chart from dr. Crawford Givens ischemia, occas pvc,.    Past Surgical History  Procedure Laterality Date  . Tonsillectomy    . Knee arthroscopy      bilateral  . Fibroids hand removed    . Fibroids breast removed    . Abdominal hysterectomy    . Cholecystectomy    . Patellar tendon repair  06/26/2011    Procedure: PATELLA TENDON REPAIR;  Surgeon: Loanne Drilling;  Location: [facility];  Service: Orthopedics;  Laterality: Right;  Repair of Extensor Mechanisim  . Fracture surgery      rt wrist orif  . Joint replacement  06/05/11    right total knee arthroplasty  . Left foot bunionectomy    . Patellar tendon repair  07/28/2011    Procedure: PATELLA TENDON REPAIR;  Surgeon: Loanne Drilling;  Location: [facility];  Service: Orthopedics;  Laterality: Right;    History  reviewed. No pertinent family history.  Social History History  Substance Use Topics  . Smoking status: Never Smoker   . Smokeless tobacco: Not on file  . Alcohol Use: No    Allergies  Allergen Reactions  . Bactrim [Sulfamethoxazole-Tmp Ds]   . Penicillins   . Statins   . Sulfa Drugs Cross Reactors     Current Outpatient Prescriptions  Medication Sig Dispense Refill  . acetaminophen (TYLENOL) 325 MG tablet Take 650 mg by mouth every 4 (four) hours as needed. Pain       . aspirin 81 MG tablet Take 81 mg by mouth daily.        . cholecalciferol (VITAMIN D) 1000 UNITS tablet Take 1,000 Units by mouth daily.       Marland Kitchen esomeprazole (NEXIUM) 40 MG capsule Take 40 mg by mouth at bedtime.       Marland Kitchen ezetimibe (ZETIA) 10 MG tablet Take 10 mg by mouth every evening.       . loratadine (CLARITIN) 10 MG tablet Take 10 mg by mouth daily.       Marland Kitchen losartan (COZAAR) 100 MG tablet       . methocarbamol (ROBAXIN) 500 MG tablet Take 1 tablet (500 mg total) by mouth every 6 (  six) hours as needed. Muscle spasms   80 tablet  0  . metoprolol (LOPRESSOR) 100 MG tablet Take 100 mg by mouth at bedtime.       . metoprolol succinate (TOPROL-XL) 100 MG 24 hr tablet       . oxycodone (OXY-IR) 5 MG capsule Take 1 capsule (5 mg total) by mouth every 4 (four) hours as needed. PAIN    80 capsule  0  . pantoprazole (PROTONIX) 40 MG tablet       . traMADol (ULTRAM) 50 MG tablet Take 50 mg by mouth 2 (two) times daily as needed. PAIN       . zolpidem (AMBIEN) 5 MG tablet Take 5 mg by mouth at bedtime as needed. SLEEP       No current facility-administered medications for this visit.    Review of Systems Review of Systems  Musculoskeletal: Positive for arthralgias and back pain.  Neurological: Positive for weakness.    Blood pressure 136/76, pulse 85, temperature 97 F (36.1 C), height 5\' 5"  (1.651 m), weight 225 lb (102.059 kg).  Physical Exam Physical Exam  Constitutional: No distress.  Obese.  HENT:   Head: Normocephalic and atraumatic.  Musculoskeletal:  Right knee scars present. Thighs appear symmetrical. No tenderness in left thigh area.  Skin: Skin is warm and dry.  Psychiatric: She has a normal mood and affect. Her behavior is normal.    Data Reviewed Note and labs from Dr. Ricki MillerPang.  There is edema seen in the musculature of the right thigh as well as the left thigh. There is fatty atrophy of the biceps femoris and semi-membranous his muscles on the left. Findings are concerning for polymyositis.  Assessment    Lower extremity weakness, elevation CPK, and MRI findings concerning for polymyositis.     Plan    Left quadriceps muscle biopsy.  Will attempt to get the vastus medialis. Procedure and risk were discussed with her. Risks include but are limited to bleeding, infection, wound problems, reaction to anesthesia. After care was discussed with her as well. She seems to understand and would like to proceed.        Roni Friberg J 02/25/2014, 9:32 AM

## 2014-02-25 NOTE — Patient Instructions (Signed)
My office will call you to schedule the left thigh muscle biopsy.

## 2014-03-16 ENCOUNTER — Encounter (HOSPITAL_BASED_OUTPATIENT_CLINIC_OR_DEPARTMENT_OTHER): Payer: Self-pay | Admitting: *Deleted

## 2014-03-16 NOTE — Progress Notes (Signed)
To come in for [facility] labs and ekg-she is to have lt TK 9/15-had rt tk 2012-had cardiac eval then, all negative

## 2014-03-16 NOTE — Progress Notes (Signed)
03/16/14 1451  OBSTRUCTIVE SLEEP APNEA  Have you ever been diagnosed with sleep apnea through a sleep study? No  Do you snore loudly (loud enough to be heard through closed doors)?  0  Do you often feel tired, fatigued, or sleepy during the daytime? 0  Has anyone observed you stop breathing during your sleep? 0  Do you have, or are you being treated for high blood pressure? 1  BMI more than 35 kg/m2? 1  Age over 73 years old? 1  Neck circumference greater than 40 cm/16 inches? 1  Gender: 0  Obstructive Sleep Apnea Score 4  Score 4 or greater  Results sent to PCP

## 2014-03-17 ENCOUNTER — Other Ambulatory Visit: Payer: Self-pay

## 2014-03-17 ENCOUNTER — Encounter (HOSPITAL_BASED_OUTPATIENT_CLINIC_OR_DEPARTMENT_OTHER)
Admission: RE | Admit: 2014-03-17 | Discharge: 2014-03-17 | Disposition: A | Discharge status: Home / Self Care | Payer: Medicare Other | Source: Ambulatory Visit | Attending: General Surgery | Admitting: General Surgery

## 2014-03-17 DIAGNOSIS — I10 Essential (primary) hypertension: Secondary | ICD-10-CM | POA: Diagnosis not present

## 2014-03-17 DIAGNOSIS — Z90710 Acquired absence of both cervix and uterus: Secondary | ICD-10-CM | POA: Diagnosis not present

## 2014-03-17 DIAGNOSIS — M3320 Polymyositis, organ involvement unspecified: Secondary | ICD-10-CM | POA: Diagnosis present

## 2014-03-17 DIAGNOSIS — Z79899 Other long term (current) drug therapy: Secondary | ICD-10-CM | POA: Diagnosis not present

## 2014-03-17 DIAGNOSIS — Z7982 Long term (current) use of aspirin: Secondary | ICD-10-CM | POA: Diagnosis not present

## 2014-03-17 DIAGNOSIS — Z96659 Presence of unspecified artificial knee joint: Secondary | ICD-10-CM | POA: Diagnosis not present

## 2014-03-17 DIAGNOSIS — M129 Arthropathy, unspecified: Secondary | ICD-10-CM | POA: Diagnosis not present

## 2014-03-17 DIAGNOSIS — Z9089 Acquired absence of other organs: Secondary | ICD-10-CM | POA: Diagnosis not present

## 2014-03-17 DIAGNOSIS — K219 Gastro-esophageal reflux disease without esophagitis: Secondary | ICD-10-CM | POA: Diagnosis not present

## 2014-03-17 LAB — CBC WITH DIFFERENTIAL/PLATELET
BASOS PCT: 0 % (ref 0–1)
Basophils Absolute: 0 10*3/uL (ref 0.0–0.1)
EOS ABS: 0 10*3/uL (ref 0.0–0.7)
Eosinophils Relative: 1 % (ref 0–5)
HCT: 43.1 % (ref 36.0–46.0)
Hemoglobin: 14.6 g/dL (ref 12.0–15.0)
LYMPHS ABS: 1.8 10*3/uL (ref 0.7–4.0)
Lymphocytes Relative: 30 % (ref 12–46)
MCH: 30.1 pg (ref 26.0–34.0)
MCHC: 33.9 g/dL (ref 30.0–36.0)
MCV: 88.9 fL (ref 78.0–100.0)
Monocytes Absolute: 0.5 10*3/uL (ref 0.1–1.0)
Monocytes Relative: 9 % (ref 3–12)
NEUTROS PCT: 60 % (ref 43–77)
Neutro Abs: 3.5 10*3/uL (ref 1.7–7.7)
Platelets: 225 10*3/uL (ref 150–400)
RBC: 4.85 MIL/uL (ref 3.87–5.11)
RDW: 13.7 % (ref 11.5–15.5)
WBC: 5.8 10*3/uL (ref 4.0–10.5)

## 2014-03-17 LAB — PROTIME-INR
INR: 1.09 (ref 0.00–1.49)
Prothrombin Time: 14.1 seconds (ref 11.6–15.2)

## 2014-03-17 LAB — COMPREHENSIVE METABOLIC PANEL
ALBUMIN: 3.9 g/dL (ref 3.5–5.2)
ALK PHOS: 68 U/L (ref 39–117)
ALT: 27 U/L (ref 0–35)
AST: 31 U/L (ref 0–37)
Anion gap: 13 (ref 5–15)
BUN: 10 mg/dL (ref 6–23)
CALCIUM: 9.8 mg/dL (ref 8.4–10.5)
CO2: 27 mEq/L (ref 19–32)
Chloride: 102 mEq/L (ref 96–112)
Creatinine, Ser: 0.61 mg/dL (ref 0.50–1.10)
GFR calc Af Amer: >90 mL/min (ref >90)
GFR calc non Af Amer: 88 mL/min — ABNORMAL LOW (ref >90)
Glucose, Bld: 85 mg/dL (ref 70–99)
Potassium: 4 mEq/L (ref 3.7–5.3)
SODIUM: 142 meq/L (ref 137–147)
TOTAL PROTEIN: 7 g/dL (ref 6.0–8.3)
Total Bilirubin: 0.7 mg/dL (ref 0.3–1.2)

## 2014-03-20 ENCOUNTER — Ambulatory Visit (HOSPITAL_BASED_OUTPATIENT_CLINIC_OR_DEPARTMENT_OTHER): Payer: Medicare Other | Admitting: Anesthesiology

## 2014-03-20 ENCOUNTER — Encounter (HOSPITAL_BASED_OUTPATIENT_CLINIC_OR_DEPARTMENT_OTHER)
Admission: RE | Disposition: A | Discharge status: Home / Self Care | Payer: Self-pay | Source: Ambulatory Visit | Attending: General Surgery

## 2014-03-20 ENCOUNTER — Encounter (HOSPITAL_BASED_OUTPATIENT_CLINIC_OR_DEPARTMENT_OTHER): Payer: Self-pay | Admitting: Anesthesiology

## 2014-03-20 ENCOUNTER — Encounter (HOSPITAL_BASED_OUTPATIENT_CLINIC_OR_DEPARTMENT_OTHER): Payer: Medicare Other | Admitting: Anesthesiology

## 2014-03-20 ENCOUNTER — Ambulatory Visit (HOSPITAL_BASED_OUTPATIENT_CLINIC_OR_DEPARTMENT_OTHER)
Admission: RE | Admit: 2014-03-20 | Discharge: 2014-03-20 | Disposition: A | Discharge status: Home / Self Care | Payer: Medicare Other | Source: Ambulatory Visit | Attending: General Surgery | Admitting: General Surgery

## 2014-03-20 DIAGNOSIS — M129 Arthropathy, unspecified: Secondary | ICD-10-CM | POA: Diagnosis not present

## 2014-03-20 DIAGNOSIS — Z7982 Long term (current) use of aspirin: Secondary | ICD-10-CM | POA: Insufficient documentation

## 2014-03-20 DIAGNOSIS — Z79899 Other long term (current) drug therapy: Secondary | ICD-10-CM | POA: Insufficient documentation

## 2014-03-20 DIAGNOSIS — M3320 Polymyositis, organ involvement unspecified: Secondary | ICD-10-CM

## 2014-03-20 DIAGNOSIS — K219 Gastro-esophageal reflux disease without esophagitis: Secondary | ICD-10-CM | POA: Diagnosis not present

## 2014-03-20 DIAGNOSIS — Z90710 Acquired absence of both cervix and uterus: Secondary | ICD-10-CM | POA: Insufficient documentation

## 2014-03-20 DIAGNOSIS — I10 Essential (primary) hypertension: Secondary | ICD-10-CM | POA: Diagnosis not present

## 2014-03-20 DIAGNOSIS — Z9089 Acquired absence of other organs: Secondary | ICD-10-CM | POA: Insufficient documentation

## 2014-03-20 DIAGNOSIS — Z96659 Presence of unspecified artificial knee joint: Secondary | ICD-10-CM | POA: Insufficient documentation

## 2014-03-20 HISTORY — PX: MUSCLE BIOPSY: SHX716

## 2014-03-20 SURGERY — MUSCLE BIOPSY
Anesthesia: Monitor Anesthesia Care | Site: Leg Upper | Laterality: Left

## 2014-03-20 MED ORDER — ONDANSETRON HCL 4 MG/2ML IJ SOLN
INTRAMUSCULAR | Status: DC | PRN
  Administered 2014-03-20: 4 mg via INTRAVENOUS

## 2014-03-20 MED ORDER — MIDAZOLAM HCL 2 MG/2ML IJ SOLN
INTRAMUSCULAR | Status: AC
  Filled 2014-03-20: qty 2

## 2014-03-20 MED ORDER — LIDOCAINE HCL (CARDIAC) 20 MG/ML IV SOLN
INTRAVENOUS | Status: DC | PRN
  Administered 2014-03-20: 20 mg via INTRAVENOUS

## 2014-03-20 MED ORDER — PROPOFOL 10 MG/ML IV BOLUS
INTRAVENOUS | Status: AC
  Filled 2014-03-20: qty 20

## 2014-03-20 MED ORDER — BUPIVACAINE HCL (PF) 0.5 % IJ SOLN
INTRAMUSCULAR | Status: AC
  Filled 2014-03-20: qty 30

## 2014-03-20 MED ORDER — FENTANYL CITRATE 0.05 MG/ML IJ SOLN: 50.0000 ug | INTRAMUSCULAR | Status: DC | PRN

## 2014-03-20 MED ORDER — LIDOCAINE HCL (PF) 1 % IJ SOLN
INTRAMUSCULAR | Status: AC
  Filled 2014-03-20: qty 30

## 2014-03-20 MED ORDER — ACETAMINOPHEN 650 MG RE SUPP: 650.0000 mg | RECTAL | Status: DC | PRN

## 2014-03-20 MED ORDER — BUPIVACAINE-EPINEPHRINE (PF) 0.5% -1:200000 IJ SOLN
INTRAMUSCULAR | Status: AC
  Filled 2014-03-20: qty 30

## 2014-03-20 MED ORDER — MIDAZOLAM HCL 2 MG/2ML IJ SOLN: 1.0000 mg | INTRAMUSCULAR | Status: DC | PRN

## 2014-03-20 MED ORDER — PROPOFOL INFUSION 10 MG/ML OPTIME
INTRAVENOUS | Status: DC | PRN
  Administered 2014-03-20: 10 mL via INTRAVENOUS
  Administered 2014-03-20 (×2): 20 mL via INTRAVENOUS

## 2014-03-20 MED ORDER — FENTANYL CITRATE 0.05 MG/ML IJ SOLN
INTRAMUSCULAR | Status: AC
  Filled 2014-03-20: qty 2

## 2014-03-20 MED ORDER — FENTANYL CITRATE 0.05 MG/ML IJ SOLN: 25.0000 ug | INTRAMUSCULAR | Status: DC | PRN

## 2014-03-20 MED ORDER — OXYCODONE HCL 5 MG PO TABS
5.0000 mg | ORAL_TABLET | ORAL | Status: DC | PRN
  Administered 2014-03-20: 5 mg via ORAL

## 2014-03-20 MED ORDER — VANCOMYCIN HCL 10 G IV SOLR
1500.0000 mg | INTRAVENOUS | Status: AC
  Administered 2014-03-20: 1500 mg via INTRAVENOUS

## 2014-03-20 MED ORDER — ACETAMINOPHEN 325 MG PO TABS: 650.0000 mg | ORAL_TABLET | ORAL | Status: DC | PRN

## 2014-03-20 MED ORDER — LIDOCAINE HCL 1 % IJ SOLN
INTRAMUSCULAR | Status: DC | PRN
  Administered 2014-03-20: 14:00:00

## 2014-03-20 MED ORDER — SODIUM CHLORIDE 0.9 % IJ SOLN: 3.0000 mL | INTRAMUSCULAR | Status: DC | PRN

## 2014-03-20 MED ORDER — LACTATED RINGERS IV SOLN
INTRAVENOUS | Status: DC
  Administered 2014-03-20: 13:00:00 via INTRAVENOUS

## 2014-03-20 MED ORDER — LIDOCAINE-EPINEPHRINE (PF) 1 %-1:200000 IJ SOLN
INTRAMUSCULAR | Status: AC
  Filled 2014-03-20: qty 10

## 2014-03-20 MED ORDER — OXYCODONE HCL 5 MG PO TABS
ORAL_TABLET | ORAL | Status: AC
  Filled 2014-03-20: qty 1

## 2014-03-20 MED ORDER — FENTANYL CITRATE 0.05 MG/ML IJ SOLN
INTRAMUSCULAR | Status: DC | PRN
  Administered 2014-03-20: 50 ug via INTRAVENOUS
  Administered 2014-03-20: 25 ug via INTRAVENOUS

## 2014-03-20 MED ORDER — SODIUM BICARBONATE 4 % IV SOLN
INTRAVENOUS | Status: AC
  Filled 2014-03-20: qty 5

## 2014-03-20 MED ORDER — MIDAZOLAM HCL 5 MG/5ML IJ SOLN
INTRAMUSCULAR | Status: DC | PRN
  Administered 2014-03-20: 1 mg via INTRAVENOUS

## 2014-03-20 MED ORDER — HYDROCODONE-ACETAMINOPHEN 5-325 MG PO TABS: 1.0000 | ORAL_TABLET | Freq: Four times a day (QID) | ORAL | Status: DC | PRN

## 2014-03-20 MED ORDER — VANCOMYCIN HCL IN DEXTROSE 500-5 MG/100ML-% IV SOLN
INTRAVENOUS | Status: AC
  Filled 2014-03-20: qty 100

## 2014-03-20 MED ORDER — VANCOMYCIN HCL IN DEXTROSE 1-5 GM/200ML-% IV SOLN
INTRAVENOUS | Status: AC
  Filled 2014-03-20: qty 200

## 2014-03-20 MED ORDER — ONDANSETRON HCL 4 MG/2ML IJ SOLN: 4.0000 mg | Freq: Once | INTRAMUSCULAR | Status: DC | PRN

## 2014-03-20 SURGICAL SUPPLY — 43 items
APL SKNCLS STERI-STRIP NONHPOA (GAUZE/BANDAGES/DRESSINGS) ×1
BENZOIN TINCTURE PRP APPL 2/3 (GAUZE/BANDAGES/DRESSINGS) ×2 IMPLANT
BLADE CLIPPER SURG (BLADE) IMPLANT
BLADE SURG 15 STRL LF DISP TIS (BLADE) ×1 IMPLANT
BLADE SURG 15 STRL SS (BLADE) ×2
CANISTER SUCT 1200ML W/VALVE (MISCELLANEOUS) IMPLANT
CHLORAPREP W/TINT 26ML (MISCELLANEOUS) ×2 IMPLANT
CLEANER CAUTERY TIP 5X5 PAD (MISCELLANEOUS) ×1 IMPLANT
COVER MAYO STAND STRL (DRAPES) ×2 IMPLANT
COVER TABLE BACK 60X90 (DRAPES) ×2 IMPLANT
DECANTER SPIKE VIAL GLASS SM (MISCELLANEOUS) ×1 IMPLANT
DRAPE LAPAROTOMY T 102X78X121 (DRAPES) ×2 IMPLANT
DRAPE UTILITY XL STRL (DRAPES) ×2 IMPLANT
DRSG TEGADERM 4X4.75 (GAUZE/BANDAGES/DRESSINGS) IMPLANT
ELECT REM PT RETURN 9FT ADLT (ELECTROSURGICAL) ×2
ELECTRODE REM PT RTRN 9FT ADLT (ELECTROSURGICAL) ×1 IMPLANT
GAUZE SPONGE 4X4 16PLY XRAY LF (GAUZE/BANDAGES/DRESSINGS) IMPLANT
GLOVE BIOGEL PI IND STRL 8.5 (GLOVE) ×1 IMPLANT
GLOVE BIOGEL PI INDICATOR 8.5 (GLOVE) ×1
GLOVE ECLIPSE 8.0 STRL XLNG CF (GLOVE) ×2 IMPLANT
GLOVE SURG SS PI 7.0 STRL IVOR (GLOVE) ×1 IMPLANT
GOWN STRL REUS W/ TWL LRG LVL3 (GOWN DISPOSABLE) ×1 IMPLANT
GOWN STRL REUS W/TWL LRG LVL3 (GOWN DISPOSABLE) ×4
NDL HYPO 25X1 1.5 SAFETY (NEEDLE) ×1 IMPLANT
NEEDLE HYPO 25X1 1.5 SAFETY (NEEDLE) ×2 IMPLANT
NS IRRIG 1000ML POUR BTL (IV SOLUTION) ×1 IMPLANT
PACK BASIN DAY SURGERY FS (CUSTOM PROCEDURE TRAY) ×2 IMPLANT
PAD CLEANER CAUTERY TIP 5X5 (MISCELLANEOUS) ×1
PENCIL BUTTON HOLSTER BLD 10FT (ELECTRODE) ×2 IMPLANT
SLEEVE SCD COMPRESS KNEE MED (MISCELLANEOUS) IMPLANT
SPONGE GAUZE 4X4 12PLY STER LF (GAUZE/BANDAGES/DRESSINGS) ×2 IMPLANT
STRIP CLOSURE SKIN 1/2X4 (GAUZE/BANDAGES/DRESSINGS) ×2 IMPLANT
SUT MON AB 4-0 PC3 18 (SUTURE) ×1 IMPLANT
SUT PROLENE 2 0 CT2 30 (SUTURE) IMPLANT
SUT VIC AB 2-0 SH 27 (SUTURE) ×2
SUT VIC AB 2-0 SH 27XBRD (SUTURE) ×1 IMPLANT
SUT VIC AB 3-0 SH 27 (SUTURE) ×2
SUT VIC AB 3-0 SH 27X BRD (SUTURE) ×1 IMPLANT
SYR CONTROL 10ML LL (SYRINGE) ×2 IMPLANT
TOWEL OR 17X24 6PK STRL BLUE (TOWEL DISPOSABLE) ×2 IMPLANT
TOWEL OR NON WOVEN STRL DISP B (DISPOSABLE) ×2 IMPLANT
TUBE CONNECTING 20X1/4 (TUBING) IMPLANT
YANKAUER SUCT BULB TIP NO VENT (SUCTIONS) IMPLANT

## 2014-03-20 NOTE — Interval H&P Note (Signed)
  History and Physical Interval Note:  03/20/2014 1:57 PM  Nancy Sosa  has presented today for surgery, with the diagnosis of Polymyositis  The various methods of treatment have been discussed with the patient and family. After consideration of risks, benefits and other options for treatment, the patient has consented to  Procedure(s): LEFT QUADRICEPS MUSCLE BIOPSY (Left) as a surgical intervention .  The patient's history has been reviewed, patient examined, no change in status, stable for surgery.  I have reviewed the patient's chart and labs.  Questions were answered to the patient's satisfaction.     Kaslyn Richburg JShela Commons

## 2014-03-20 NOTE — Anesthesia Preprocedure Evaluation (Addendum)
Anesthesia Evaluation  Patient identified by MRN, date of birth, ID band Patient awake    Reviewed: Allergy & Precautions, H&P , NPO status , Patient's Chart, lab work & pertinent test results, reviewed documented beta blocker date and time   Airway Mallampati: II TM Distance: >3 FB Neck ROM: Full    Dental  (+) Teeth Intact, Dental Advisory Given   Pulmonary  breath sounds clear to auscultation        Cardiovascular hypertension, Rhythm:Regular Rate:Normal     Neuro/Psych    GI/Hepatic   Endo/Other    Renal/GU      Musculoskeletal   Abdominal   Peds  Hematology   Anesthesia Other Findings   Reproductive/Obstetrics                          Anesthesia Physical Anesthesia Plan  ASA: III  Anesthesia Plan: MAC   Post-op Pain Management:    Induction: Intravenous  Airway Management Planned: Natural Airway and Simple Face Mask  Additional Equipment:   Intra-op Plan:   Post-operative Plan:   Informed Consent: I have reviewed the patients History and Physical, chart, labs and discussed the procedure including the risks, benefits and alternatives for the proposed anesthesia with the patient or authorized representative who has indicated his/her understanding and acceptance.   Dental advisory given  Plan Discussed with: CRNA and Anesthesiologist  Anesthesia Plan Comments:         Anesthesia Quick Evaluation

## 2014-03-20 NOTE — Transfer of Care (Signed)
Immediate Anesthesia Transfer of Care Note  Patient: Nancy Sosa  Procedure(s) Performed: Procedure(s): LEFT QUADRICEPS MUSCLE BIOPSY (Left)  Patient Location: PACU  Anesthesia Type:MAC  Level of Consciousness: awake, alert  and oriented, RA@ 98%  Airway & Oxygen Therapy: Patient Spontanous Breathing  Post-op Assessment: Report given to PACU RN, Post -op Vital signs reviewed and stable and Patient moving all extremities  Post vital signs: Reviewed and stable  Complications: No apparent anesthesia complications

## 2014-03-20 NOTE — Anesthesia Procedure Notes (Signed)
Procedure Name: MAC Date/Time: 03/20/2014 2:19 PM Performed by: Curly ShoresRAFT, Yacine Droz W Pre-anesthesia Checklist: Patient identified, Emergency Drugs available, Suction available and Patient being monitored Patient Re-evaluated:Patient Re-evaluated prior to inductionOxygen Delivery Method: Simple face mask Preoxygenation: Pre-oxygenation with 100% oxygen Dental Injury: Teeth and Oropharynx as per pre-operative assessment

## 2014-03-20 NOTE — Op Note (Signed)
Operative Note  Gaylyn CheersLouise M Goodley female 73 y.o. 03/20/2014  PREOPERATIVE DX:  Polymyositis  POSTOPERATIVE DX:  Same  PROCEDURE:  Left quadriceps muscle biopsy (vastus medialis)           Surgeon: Adolph PollackOSENBOWER,Maryalyce Sanjuan J   Assistants: none  Anesthesia: Monitored Local Anesthesia with Sedation  Indications: this is a 73 year old female with progressive muscular weakness and elevated CPKs. Polymyositis his suspected. She now presents for quadriceps muscle biopsy on the left.    Procedure Detail :  She was seen in the holding area in the left thigh Markert my initials. She is brought to the operating room placed supine on the operating table and sedation was given. The left thigh was sterilely prepped and draped. I approached the anterior thigh medial to the midline where the vastus medialis would be. Local anesthetic (mixture of Xylocaine, Marcaine, and sodium bicarbonate) was infiltrated into the skin and subcutaneous tissues. A longitudinal incision was made through the skin and subcutaneous tissues to the quadriceps muscle fascia was identified. A small 3 cm incision was made in the muscle. Using sharp dissection I biopsied a 2 cm long but one centimeter wide piece of muscle. This was wrapped in a saline moistened gauze and taken to pathology. Bleeding was controlled with electrocautery.  Once hemostasis was adequate the fascia was reapproximated with running 2-0 Vicryl suture. The subcutaneous tissues approximated with running 3-0 Vicryl suture. The skin was closed with a running 4-0 Monocryl subcuticular stitch. Steri-Strips and a sterile dressing  was applied.  She tolerated the procedure without any apparent complications and was taken to the recovery room in satisfactory condition.  Estimated Blood Loss:  less than 100 mL         Drains: none  Blood Given: none          Specimens: segment of left vastus medialis muscle        Complications:  * No complications entered in OR log  *         Disposition: PACU - hemodynamically stable.         Condition: stable

## 2014-03-20 NOTE — Discharge Instructions (Addendum)
Apply ice to the area for the next 3 days. Light activity for 3 days then may resume normal activities as comfortable following that. Call for heavy bleeding or wound problems. May shower tomorrow. Remove bandage in 3 days. Leave Steri-Strips on the wound until they fall off.  Call the office for a appointment after surgery. We would like to see you in 2-3 weeks. [phone]   Post Anesthesia Home Care Instructions  Activity: Get plenty of rest for the remainder of the day. A responsible adult should stay with you for 24 hours following the procedure.  For the next 24 hours, DO NOT: -Drive a car -Advertising copywriterperate machinery -Drink alcoholic beverages -Take any medication unless instructed by your physician -Make any legal decisions or sign important papers.  Meals: Start with liquid foods such as gelatin or soup. Progress to regular foods as tolerated. Avoid greasy, spicy, heavy foods. If nausea and/or vomiting occur, drink only clear liquids until the nausea and/or vomiting subsides. Call your physician if vomiting continues.  Special Instructions/Symptoms: Your throat may feel dry or sore from the anesthesia or the breathing tube placed in your throat during surgery. If this causes discomfort, gargle with warm salt water. The discomfort should disappear within 24 hours.

## 2014-03-20 NOTE — H&P (View-Only) (Signed)
[name], female   DOB: Apr 12, 1941, 73 y.o.   MRN: [id number]  Chief Complaint  Patient presents with  . eval left thigh    HPI Nancy Sosa is a 73 y.o. female.   HPI  She is referred by Dr. Azzie Roup because of lower tremor and he weakness, elevated CPK levels, an MRI of left thigh suggesting polymyositis.  She is scheduled to have a left knee replacement in September. She does walk with a cane but this is because of of her degenerative joint disease of the left knee.  Past Medical History  Diagnosis Date  . Arthritis   . Asthma     also seasonal allergies/bronchitis  . GERD (gastroesophageal reflux disease)   . Barrett's esophagus   . Hypercholesterolemia   . Cataracts, bilateral   . Varicose veins   . Bursitis of shoulder   . S/P left knee arthroscopy   . S/P right knee arthroscopy   . Hypertension     pt was on benicar hct and metoprolol--but after her knee replacement surg oct 2012--while in rehab--her b/p stayed so low that the benicar hct was d/c--she still takes the metoprolol  . Dysrhythmia     chronic tachycardia.  nuclear stress test 03/31/11 for preop clearance for TKA --report on this chart from dr. Crawford Givens ischemia, occas pvc,.    Past Surgical History  Procedure Laterality Date  . Tonsillectomy    . Knee arthroscopy      bilateral  . Fibroids hand removed    . Fibroids breast removed    . Abdominal hysterectomy    . Cholecystectomy    . Patellar tendon repair  06/26/2011    Procedure: PATELLA TENDON REPAIR;  Surgeon: Loanne Drilling;  Location: [facility];  Service: Orthopedics;  Laterality: Right;  Repair of Extensor Mechanisim  . Fracture surgery      rt wrist orif  . Joint replacement  06/05/11    right total knee arthroplasty  . Left foot bunionectomy    . Patellar tendon repair  07/28/2011    Procedure: PATELLA TENDON REPAIR;  Surgeon: Loanne Drilling;  Location: [facility];  Service: Orthopedics;  Laterality: Right;    History  reviewed. No pertinent family history.  Social History History  Substance Use Topics  . Smoking status: Never Smoker   . Smokeless tobacco: Not on file  . Alcohol Use: No    Allergies  Allergen Reactions  . Bactrim [Sulfamethoxazole-Tmp Ds]   . Penicillins   . Statins   . Sulfa Drugs Cross Reactors     Current Outpatient Prescriptions  Medication Sig Dispense Refill  . acetaminophen (TYLENOL) 325 MG tablet Take 650 mg by mouth every 4 (four) hours as needed. Pain       . aspirin 81 MG tablet Take 81 mg by mouth daily.        . cholecalciferol (VITAMIN D) 1000 UNITS tablet Take 1,000 Units by mouth daily.       Marland Kitchen esomeprazole (NEXIUM) 40 MG capsule Take 40 mg by mouth at bedtime.       Marland Kitchen ezetimibe (ZETIA) 10 MG tablet Take 10 mg by mouth every evening.       . loratadine (CLARITIN) 10 MG tablet Take 10 mg by mouth daily.       Marland Kitchen losartan (COZAAR) 100 MG tablet       . methocarbamol (ROBAXIN) 500 MG tablet Take 1 tablet (500 mg total) by mouth every 6 (  six) hours as needed. Muscle spasms   80 tablet  0  . metoprolol (LOPRESSOR) 100 MG tablet Take 100 mg by mouth at bedtime.       . metoprolol succinate (TOPROL-XL) 100 MG 24 hr tablet       . oxycodone (OXY-IR) 5 MG capsule Take 1 capsule (5 mg total) by mouth every 4 (four) hours as needed. PAIN    80 capsule  0  . pantoprazole (PROTONIX) 40 MG tablet       . traMADol (ULTRAM) 50 MG tablet Take 50 mg by mouth 2 (two) times daily as needed. PAIN       . zolpidem (AMBIEN) 5 MG tablet Take 5 mg by mouth at bedtime as needed. SLEEP       No current facility-administered medications for this visit.    Review of Systems Review of Systems  Musculoskeletal: Positive for arthralgias and back pain.  Neurological: Positive for weakness.    Blood pressure 136/76, pulse 85, temperature 97 F (36.1 C), height 5\' 5"  (1.651 m), weight 225 lb (102.059 kg).  Physical Exam Physical Exam  Constitutional: No distress.  Obese.  HENT:   Head: Normocephalic and atraumatic.  Musculoskeletal:  Right knee scars present. Thighs appear symmetrical. No tenderness in left thigh area.  Skin: Skin is warm and dry.  Psychiatric: She has a normal mood and affect. Her behavior is normal.    Data Reviewed Note and labs from Dr. Ricki MillerPang.  There is edema seen in the musculature of the right thigh as well as the left thigh. There is fatty atrophy of the biceps femoris and semi-membranous his muscles on the left. Findings are concerning for polymyositis.  Assessment    Lower extremity weakness, elevation CPK, and MRI findings concerning for polymyositis.     Plan    Left quadriceps muscle biopsy.  Will attempt to get the vastus medialis. Procedure and risk were discussed with her. Risks include but are limited to bleeding, infection, wound problems, reaction to anesthesia. After care was discussed with her as well. She seems to understand and would like to proceed.        Nancy Sosa 02/25/2014, 9:32 AM

## 2014-03-20 NOTE — Anesthesia Postprocedure Evaluation (Signed)
  Anesthesia Post-op Note  Patient: Gaylyn CheersLouise M Retz  Procedure(s) Performed: Procedure(s): LEFT QUADRICEPS MUSCLE BIOPSY (Left)  Patient Location: PACU  Anesthesia Type:MAC  Level of Consciousness: awake, alert  and oriented  Airway and Oxygen Therapy: Patient Spontanous Breathing  Post-op Pain: none  Post-op Assessment: Post-op Vital signs reviewed, Patient's Cardiovascular Status Stable, Respiratory Function Stable, Patent Airway and Pain level controlled  Post-op Vital Signs: stable  Last Vitals:  Filed Vitals:   03/20/14 1539  BP:   Pulse: 72  Temp:   Resp: 19    Complications: No apparent anesthesia complications

## 2014-03-23 ENCOUNTER — Encounter (HOSPITAL_BASED_OUTPATIENT_CLINIC_OR_DEPARTMENT_OTHER): Payer: Self-pay | Admitting: General Surgery

## 2014-04-06 ENCOUNTER — Encounter (HOSPITAL_COMMUNITY): Payer: Self-pay

## 2014-04-07 ENCOUNTER — Encounter (INDEPENDENT_AMBULATORY_CARE_PROVIDER_SITE_OTHER): Payer: Medicare Other | Admitting: General Surgery

## 2014-04-09 ENCOUNTER — Other Ambulatory Visit: Payer: Self-pay | Admitting: Orthopedic Surgery

## 2014-04-09 NOTE — Progress Notes (Signed)
Preoperative surgical orders have been place into the Epic hospital system for Nancy Sosa on 04/09/2014, 12:39 PM  by Patrica Duel for surgery on 05/04/2014.  Preop Total Knee orders including Experal, IV Tylenol, and IV Decadron as long as there are no contraindications to the above medications. [name], PA-C

## 2014-04-17 ENCOUNTER — Ambulatory Visit (INDEPENDENT_AMBULATORY_CARE_PROVIDER_SITE_OTHER): Payer: Medicare Other | Admitting: Neurology

## 2014-04-17 ENCOUNTER — Ambulatory Visit (INDEPENDENT_AMBULATORY_CARE_PROVIDER_SITE_OTHER): Payer: Self-pay

## 2014-04-17 DIAGNOSIS — M3320 Polymyositis, organ involvement unspecified: Secondary | ICD-10-CM

## 2014-04-17 DIAGNOSIS — Z0289 Encounter for other administrative examinations: Secondary | ICD-10-CM

## 2014-04-19 NOTE — Progress Notes (Signed)
  [facility]    Provider:  Dr Lucia Gaskins Referring Provider: Juline Patch, MD Primary Care Physician:  Juline Patch, MD  History:  Nancy Sosa is a 73 y.o. female here as a referral from Dr. Ricki Miller for lower extremity weakness. She has difficulty climbing stairs. Denies weakness in the arms except for weak grip and difficulty opening jars. She reports paresthesias in the fingers and nocturnal awakenings to try and shake out the hand numbness.  MRI of the left thigh was suggestive of myositis and she recently had a left leg biopsy.   Summary: Nerve Conduction studies were performed on the right upper and bilateral lower extremities.  Median motor conduction showed prolonged distal onset latency (5.43ms, N<4.0) with normal amplitude and conduction velocity and normal F Wave latency.  Median sensory conduction showed prolonged distal peak latency (4.5ms, N<3.9) with normal amplitude.  Ulnar motor conduction was within normal limits with normal F wave latency.  Ulnar sensory conduction was within normal limits.  Peroneal and Tibial motor conductions were within normal limits with normal F Wave latencies. Sural sensory conduction was within normal limits.  EMG needle evaluation was performed on selected right lower and right upper extremity muscles and a thoracic paraspinal muscle: The right Deltoid and right Biceps muscles showed widespread increased spontaneous activity, reduced motor unit amplitude, reduced motor unit duration, moderately increased polyphasic potentials, and early recruitment. The right Opponens Pollicis muscle showed moderately increased spontaneous activity. The right Iliopsoas and right Vastus Medialis muscles showed early recruitment.  The right T10 paraspinal muscle showed slightly increased spontaneous activity. Attempted the right Anterior Tibialis however muscles are unreliable after surgery. Could not test the right First Dorsal Interosseous muscle due to  pain. All remaining muscles were normal: The right Triceps, right Extensor Indicis, right Medial Gastrocnemius, right Extensor Hallucis Longus.     Conclusion: EMG shows spontaneous activity in proximal right upper extremity muscles as well as in a right thoracic paraspinal muscle.  Low-amplitude, short-duration, polyphasic motor units with early recruitment were seen in proximal right upper extremity muscles. Early recruitment was seen in proximal right lower extremity muscles.  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 There is also concomitant right median compression neuropathy at the wrist, or Carpal Tunnel Syndrome, with acute/ongoing denervation in a distal right median-innervated muscle. Clinical correlation is recommended.   Garth Bigness, MD  [facility] [address] [place], [place] [address]  Phone [phone] Fax [phone]

## 2014-04-24 ENCOUNTER — Encounter: Payer: Self-pay | Admitting: Neurology

## 2014-04-27 ENCOUNTER — Ambulatory Visit (INDEPENDENT_AMBULATORY_CARE_PROVIDER_SITE_OTHER): Payer: Medicare Other | Admitting: Neurology

## 2014-04-27 ENCOUNTER — Encounter: Payer: Self-pay | Admitting: Neurology

## 2014-04-27 VITALS — BP 134/82 | HR 68 | Ht 67.0 in | Wt 223.2 lb

## 2014-04-27 DIAGNOSIS — G5600 Carpal tunnel syndrome, unspecified upper limb: Secondary | ICD-10-CM

## 2014-04-27 DIAGNOSIS — G5603 Carpal tunnel syndrome, bilateral upper limbs: Secondary | ICD-10-CM

## 2014-04-27 DIAGNOSIS — M3320 Polymyositis, organ involvement unspecified: Secondary | ICD-10-CM

## 2014-04-27 DIAGNOSIS — IMO0001 Reserved for inherently not codable concepts without codable children: Secondary | ICD-10-CM

## 2014-04-27 MED ORDER — PREDNISONE 20 MG PO TABS: 60.0000 mg | ORAL_TABLET | Freq: Every day | ORAL | Status: DC

## 2014-04-27 NOTE — Progress Notes (Signed)
[facility]    Provider:  Dr Lucia Gaskins Referring Provider: Juline Patch, MD Primary Care Physician:  Juline Patch, MD  CC:  Muscle Weakness  HPI:  Nancy Sosa is a 73 y.o. female here as a consult from Dr. Ricki Miller for Muscle weakness. Patient reports she has been progressively weak. Symptoms started 4 years ago. At hat time she noticed that she couldn't walk long distances, had to sit and rest. The symptoms have slowly progressed and now has difficulty climbing steps and experiences burning pain in the leg muscles. Sometimes it feels like a "fork pulling the muscles apart". She feels her muscles are deteriorating. Arms also get tired when she holds objects for long periods and she also gets tired brushing her hair  and also washing her back - anything that requires keeping her arms up. Symptoms are the same bilaterally; symmetric. Symptoms are daily and making her daily tasks difficult. Now it is so severe that she can't get up stairs such as those at church. No stairs in her house but the stairs in church are a big problem, can't get up them. Can't get out of seats without using her arms. Denies rashes. Stopped statin drugs which did not improve symptoms. No new drugs at the time of symptoms.  She also reports weakness in the hands and dropping objects. She has paresthesias in the fingers.   Denies any family history of inflammatory,rheumatologic or neuromuscular disorders   Reviewed notes, labs and imaging from outside physicians, which showed: CK elevated in 2009. At that time an EMG/NCS was unremarkable. She recently had an MRI of her proximal leg muscles that was concerning for myositis however a biopsy was negative for myopathic changes or inflammation. Recent EMG/NCS found electrophysiologic evidence for a proximal upper > lower proximal myopathy with denervating feature, or myositis. Recent CBC with diff and BMP were unremarkable.   Review of Systems: Patient complains of  symptoms per HPI as well as the following symptoms weight loss, fatigue, easy bruising. Palpitations, SOB, feeling hot, snoring, headache, weakness, joint pain, ringing in ears, joint swelling, cramps, aching muscles, allergies, birth marks, moles. Pertinent negatives per HPI. Otherwise out of a complete 14 system review, and all other reviewed systems are negative.   History   Social History  . Marital Status: Widowed    Spouse Name: N/A    Number of Children: 0  . Years of Education: 12+   Occupational History  . Retired    Social History Main Topics  . Smoking status: Never Smoker   . Smokeless tobacco: Never Used  . Alcohol Use: No  . Drug Use: No  . Sexual Activity: Not on file   Other Topics Concern  . Not on file   Social History Narrative   Patient lives at home alone.    Patient is right handed.    Patient has 1 deceased child.    Patient is retired.     History reviewed. No pertinent family history.  Past Medical History  Diagnosis Date  . Arthritis   . Asthma     also seasonal allergies/bronchitis  . GERD (gastroesophageal reflux disease)   . Barrett's esophagus   . Hypercholesterolemia   . Cataracts, bilateral   . Varicose veins   . Bursitis of shoulder   . S/P left knee arthroscopy   . S/P right knee arthroscopy   . Hypertension     pt was on benicar hct and metoprolol--but after her knee replacement surg oct 2012--while  in rehab--her b/p stayed so low that the benicar hct was d/c--she still takes the metoprolol  . Dysrhythmia     chronic tachycardia.  nuclear stress test 03/31/11 for preop clearance for TKA --report on this chart from dr. Crawford Givens ischemia, occas pvc,.    Past Surgical History  Procedure Laterality Date  . Tonsillectomy    . Knee arthroscopy      bilateral  . Fibroids hand removed    . Fibroids breast removed    . Abdominal hysterectomy    . Cholecystectomy    . Patellar tendon repair  06/26/2011    Procedure: PATELLA TENDON  REPAIR;  Surgeon: Loanne Drilling;  Location: [facility];  Service: Orthopedics;  Laterality: Right;  Repair of Extensor Mechanisim  . Fracture surgery      rt wrist orif  . Joint replacement  06/05/11    right total knee arthroplasty  . Left foot bunionectomy    . Patellar tendon repair  07/28/2011    Procedure: PATELLA TENDON REPAIR;  Surgeon: Loanne Drilling;  Location: [facility];  Service: Orthopedics;  Laterality: Right;  . Colonoscopy    . Upper gi endoscopy    . Muscle biopsy Left 03/20/2014    Procedure: LEFT QUADRICEPS MUSCLE BIOPSY;  Surgeon: Adolph Pollack, MD;  Location: [facility];  Service: General;  Laterality: Left;    Current Outpatient Prescriptions  Medication Sig Dispense Refill  . acetaminophen (TYLENOL) 325 MG tablet Take 650 mg by mouth every 4 (four) hours as needed. Pain       . aspirin 81 MG tablet Take 81 mg by mouth daily.        . cholecalciferol (VITAMIN D) 1000 UNITS tablet Take 1,000 Units by mouth daily.       . cyclobenzaprine (FLEXERIL) 10 MG tablet       . ezetimibe (ZETIA) 10 MG tablet Take 10 mg by mouth every evening.       . loratadine (CLARITIN) 10 MG tablet Take 10 mg by mouth daily.       Marland Kitchen losartan (COZAAR) 100 MG tablet       . metoprolol succinate (TOPROL-XL) 100 MG 24 hr tablet       . Multiple Vitamin (MULTIVITAMIN WITH MINERALS) TABS tablet Take 1 tablet by mouth daily.      . pantoprazole (PROTONIX) 40 MG tablet       . traMADol (ULTRAM) 50 MG tablet Take 50 mg by mouth 2 (two) times daily as needed. PAIN       . HYDROcodone-acetaminophen (NORCO) 5-325 MG per tablet Take 1-2 tablets by mouth every 6 (six) hours as needed.  10 tablet  0  . predniSONE (DELTASONE) 20 MG tablet Take 3 tablets (60 mg total) by mouth daily with breakfast.  90 tablet  0   No current facility-administered medications for this visit.    Allergies as of 04/27/2014 - Review Complete 04/27/2014  Allergen Reaction Noted  . Bactrim  [sulfamethoxazole-tmp ds] Other (See Comments) 06/23/2011  . Penicillins Itching 06/23/2011  . Statins Other (See Comments) 06/23/2011  . Sulfa drugs cross reactors  06/23/2011    Vitals: BP 134/82  Pulse 68  Ht  (1.702 m)  Wt 223 lb 3.2 oz (101.243 kg)  BMI 34.95 kg/m2 Last Weight:  Wt Readings from Last 1 Encounters:  04/27/14 223 lb 3.2 oz (101.243 kg)   Last Height:   Ht Readings from Last 1 Encounters:  04/27/14  (1.702  m)    Physical exam: Exam: Gen: NAD, conversant, well nourised, obese, well groomed                     CV: RRR, no MRG. No Carotid Bruits.  Extremities: 1+ peripheral edema in ankles, warm, nontender Eyes: Conjunctivae clear without exudates or hemorrhage Skin: No rash (no gottron's papules, no heliotrope or malar rash)  Neuro: Detailed Neurologic Exam  Speech:    Speech is normal; fluent and spontaneous with normal comprehension.  Cognition:    The patient is oriented to person, place, and time;     recent and remote memory intact;     language fluent;     normal attention, concentration,     Intact fund of knowledge Cranial Nerves:    The pupils are equal, round, and reactive to light. The fundi are normal and spontaneous venous pulsations are present. Visual fields are full to finger confrontation. Extraocular movements are intact. Trigeminal sensation is intact and the muscles of mastication are normal. The face is symmetric. The palate elevates in the midline. Voice is normal. Shoulder shrug is normal. The tongue has normal motion without fasciculations.   Coordination:    Normal finger to nose and heel to shin. Normal rapid alternating movements.   Gait:    Unable to walk on heels or toes. Ambulates with a cane. Waddling.  Motor Observation:    No asymmetry, no atrophy, and no involuntary movements noted.  Tone:    Normal muscle tone.    Posture:    Posture is erect.    Strength:    Biceps and deltoid 3/5, Triceps 5/5,  left wrist extension 4+/5 (previous injury), 4/5 interossei, 4/5 left > right Opponens Pollicis weakness   4+ hip flexion otherwise 5/5 lower extremity   5/5 distal finger flexion     Sensation: Intact to LT and pinprick     Reflex Exam:  DTR's:    Trace uppers, absent lowers (may be due to difficult exam secondary to large body habitus)   Toes:    The toes are downgoing bilaterally.   Clonus:    Clonus is absent.      Assessment/Plan:  Patient is a lovel 73 year old female who presents with several years of weakness most pronounced going up stairs and brushing hair. Symptoms felt to be severely affecting life and she can't even climb the stairs at church and she is now using a cane to ambulate. Neuro exam significant for proximal, symmetric upper > lower muscle weakness and absence of skin findings. 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 Will order myositis-specific labs and start prednisone /day. Prednisone has significant side effects such as elevated glucose, agitation, insomnia, elevated blood pressure, weight gain, osteopenia/porosis. Will have to closely monitor patient and labs. Patient also complains of distal hand motor weakness consistent with CTS, confimred on EMG/NCS. Suggest supportive measures such as wrist splints at night and follow clinically.   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 Dareen Piano and given presenting symptoms and EMG findings, agreed that a course of prednisone  day would be beneficial.  - Will order myositis-specific labs and repeat CK. Can monitor CK as an indicator of disease progression or improvement.  - Discussed prednisone and significant side effects, will have to closely monitor lab values and vitals  CTS - Supportive, hand splints at night.  - Will follow clinically and can refer to hand specialist if needed.  A total of 60 minutes was spent in with this patient. Over half this  time was spent on counseling patient on the diagnosis and different therapeutic options available.   Naomie Dean, MD  [facility] [address] [place], [place] [address]  Phone [phone] Fax [phone]

## 2014-04-27 NOTE — Patient Instructions (Signed)
Overall you are doing fairly well but I do want to suggest a few things today:   Remember to drink plenty of fluid, eat healthy meals and do not skip any meals. Try to eat protein with a every meal and eat a healthy snack such as fruit or nuts in between meals. Try to keep a regular sleep-wake schedule and try to exercise daily, particularly in the form of walking, 20-30 minutes a day, if you can.   As far as your medications are concerned, I would like to suggest: Daily prednisone. Discussed most common side effects such as insomnia, weight gain, agitation, high blood pressure, elevated glucose  As far as diagnostic testing: Will discuss labs with Dr. Dareen Piano and order any remaining labs. Please go to any [facility] or come back to the office between the hours of 9am and 3pm (closed 12-1)  I would like to see you back in 4 weeks, sooner if we need to. Please call us with any interim questions, concerns, problems, updates or refill requests.   Please also call us for any test results so we can go over those with you on the phone.  My clinical assistant and will answer any of your questions and relay your messages to me and also relay most of my messages to you.   Our phone number is [phone]. We also have an after hours call service for urgent matters and there is a physician on-call for urgent questions. For any emergencies you know to call 911 or go to the nearest emergency room

## 2014-04-28 ENCOUNTER — Telehealth: Payer: Self-pay | Admitting: *Deleted

## 2014-04-28 DIAGNOSIS — G5600 Carpal tunnel syndrome, unspecified upper limb: Secondary | ICD-10-CM | POA: Insufficient documentation

## 2014-04-28 NOTE — Telephone Encounter (Signed)
Pt called and wanting clarification on the prednisone that Dr. Lucia Gaskins ordered for her.   Reading ofv note and medication prescription, she wants her to take prednisone  po daily in am with breakfast.  Pt verbalized understanding.  I relayed to her to call with questions at anytime.

## 2014-04-30 ENCOUNTER — Telehealth: Payer: Self-pay | Admitting: Neurology

## 2014-04-30 NOTE — Telephone Encounter (Signed)
Patient calling to check whether she needs to come in today for additional bloodwork, please return call and advise.

## 2014-04-30 NOTE — Telephone Encounter (Signed)
No fasting, thank you!

## 2014-04-30 NOTE — Telephone Encounter (Signed)
Patient is coming 05-01-2014 to have labs collected.

## 2014-04-30 NOTE — Telephone Encounter (Signed)
Patient calling back again to ask if labs are fasting or not. Please call and advise. If she doesn't answer at [phone], please call her on her cell at [phone].

## 2014-04-30 NOTE — Telephone Encounter (Signed)
I spoke to pt and from what I could see, she did not need to fast. Pt informed and will eat breakfast.

## 2014-05-01 ENCOUNTER — Other Ambulatory Visit: Payer: Self-pay | Admitting: Neurology

## 2014-05-01 ENCOUNTER — Other Ambulatory Visit (INDEPENDENT_AMBULATORY_CARE_PROVIDER_SITE_OTHER): Payer: Self-pay

## 2014-05-01 DIAGNOSIS — M3320 Polymyositis, organ involvement unspecified: Secondary | ICD-10-CM

## 2014-05-01 DIAGNOSIS — Z0289 Encounter for other administrative examinations: Secondary | ICD-10-CM

## 2014-05-02 LAB — ANTI-JO 1 ANTIBODY, IGG

## 2014-05-03 LAB — LACTATE DEHYDROGENASE: LDH: 364 IU/L — AB (ref 119–226)

## 2014-05-03 LAB — CK ISOENZYMES
CK-BB: 0 %
CK-MB: 3 % (ref 0–3)
CK-MM: 91 % — AB (ref 97–100)
Macro Type 1: 6 % — ABNORMAL HIGH
Macro Type 2: 0 %
Total CK: 478 U/L — ABNORMAL HIGH (ref 24–173)

## 2014-05-03 LAB — ALDOLASE: ALDOLASE: 9.3 U/L (ref 3.3–10.3)

## 2014-05-03 LAB — ANA: ANA: NEGATIVE

## 2014-05-03 LAB — MYOGLOBIN, SERUM: Myoglobin: 364 ng/mL — ABNORMAL HIGH (ref 25–58)

## 2014-05-04 ENCOUNTER — Inpatient Hospital Stay (HOSPITAL_COMMUNITY): Admission: RE | Admit: 2014-05-04 | Payer: Medicare Other | Source: Ambulatory Visit | Admitting: Orthopedic Surgery

## 2014-05-04 ENCOUNTER — Encounter (HOSPITAL_COMMUNITY): Admission: RE | Payer: Self-pay | Source: Ambulatory Visit

## 2014-05-04 SURGERY — ARTHROPLASTY, KNEE, TOTAL
Anesthesia: Choice | Site: Knee | Laterality: Left

## 2014-05-12 ENCOUNTER — Telehealth: Payer: Self-pay | Admitting: Neurology

## 2014-05-12 NOTE — Telephone Encounter (Signed)
Spoke to patient, she is feeling better since starting the prednisone. She is feeling stronger. Discussed the lab results and that they support the diagnosis of polymyositis. She is following up with me this month.

## 2014-05-29 ENCOUNTER — Ambulatory Visit (INDEPENDENT_AMBULATORY_CARE_PROVIDER_SITE_OTHER): Payer: Medicare Other | Admitting: Neurology

## 2014-05-29 ENCOUNTER — Encounter: Payer: Self-pay | Admitting: Neurology

## 2014-05-29 VITALS — BP 153/81 | HR 75 | Ht 65.5 in | Wt 229.8 lb

## 2014-05-29 DIAGNOSIS — Z7952 Long term (current) use of systemic steroids: Secondary | ICD-10-CM

## 2014-05-29 DIAGNOSIS — M3320 Polymyositis, organ involvement unspecified: Secondary | ICD-10-CM

## 2014-05-29 NOTE — Progress Notes (Signed)
[facility]    Provider:  Dr Lucia Gaskins Referring Provider: Juline Patch, MD Primary Care Physician:  Juline Patch, MD  CC:  Polymyositis  HPI:  Nancy Sosa is a 73 y.o. female here as a follow up for polymyositis  She started 60mg  prednisone one month ago. She has gained 6 pounds. She is not sleeping. However she is feeling better and several people at church have noticed she is moving better and have mentioned it to her. She can get out of chairs better.  She can get out of a chair without using her arms. Now she is brushing her hair and arms not getting tired, she can open a jar now which she couldn't do before. She is walking longer distances, she can go to the mailbox and back and she is better. She has walked the stairs at church, she went up and down. She can walk well [place] the farmers [place] to all the booths.   She has gained weight. She now has insomnia, She is wide awake at 2am. Sleeping worse. Her blood pressure has increased but only minimally. She feels her CTS has improved, not dropping objects as much.   Review of Systems: Patient complains of symptoms per HPI as well as the following symptoms fatigue, eye itching, light sensitivity, heat intolerance, env allergies, bruising easily, runny nose, SOB, leg swelling, palpitations, constipation, insomnia, frequent wakening, snoring, joint pain, aching muscles, muscle cramps, walking difficulty, weakness, headache. Pertinent negatives per HPI. All others negative.   History   Social History  . Marital Status: Widowed    Spouse Name: N/A    Number of Children: 0  . Years of Education: 12+   Occupational History  . Retired    Social History Main Topics  . Smoking status: Never Smoker   . Smokeless tobacco: Never Used  . Alcohol Use: No  . Drug Use: No  . Sexual Activity: Not on file   Other Topics Concern  . Not on file   Social History Narrative   Patient lives at home alone.    Patient is  right handed.    Patient has 1 deceased child.    Patient is retired.     Family History  Problem Relation Age of Onset  . Neuromuscular disorder Neg Hx   . Neuropathy Neg Hx   . Multiple sclerosis Neg Hx     Past Medical History  Diagnosis Date  . Arthritis   . Asthma     also seasonal allergies/bronchitis  . GERD (gastroesophageal reflux disease)   . Barrett's esophagus   . Hypercholesterolemia   . Cataracts, bilateral   . Varicose veins   . Bursitis of shoulder   . S/P left knee arthroscopy   . S/P right knee arthroscopy   . Hypertension     pt was on benicar hct and metoprolol--but after her knee replacement surg oct 2012--while in rehab--her b/p stayed so low that the benicar hct was d/c--she still takes the metoprolol  . Dysrhythmia     chronic tachycardia.  nuclear stress test 03/31/11 for preop clearance for TKA --report on this chart from dr. Crawford Givens ischemia, occas pvc,.    Past Surgical History  Procedure Laterality Date  . Tonsillectomy    . Knee arthroscopy      bilateral  . Fibroids hand removed    . Fibroids breast removed    . Abdominal hysterectomy    . Cholecystectomy    . Patellar tendon repair  06/26/2011    Procedure: PATELLA TENDON REPAIR;  Surgeon: Loanne DrillingFrank V Aluisio;  Location: [facility];  Service: Orthopedics;  Laterality: Right;  Repair of Extensor Mechanisim  . Fracture surgery      rt wrist orif  . Joint replacement  06/05/11    right total knee arthroplasty  . Left foot bunionectomy    . Patellar tendon repair  07/28/2011    Procedure: PATELLA TENDON REPAIR;  Surgeon: Loanne DrillingFrank V Aluisio;  Location: [facility];  Service: Orthopedics;  Laterality: Right;  . Colonoscopy    . Upper gi endoscopy    . Muscle biopsy Left 03/20/2014    Procedure: LEFT QUADRICEPS MUSCLE BIOPSY;  Surgeon: Adolph Pollackodd J Rosenbower, MD;  [facility];  Service: General;  Laterality: Left;    Current Outpatient Prescriptions  Medication Sig Dispense Refill  .  aspirin 81 MG tablet Take 81 mg by mouth daily.        . cholecalciferol (VITAMIN D) 1000 UNITS tablet Take 1,000 Units by mouth daily.       Marland Kitchen. ezetimibe (ZETIA) 10 MG tablet Take 10 mg by mouth every evening.       . loratadine (CLARITIN) 10 MG tablet Take 10 mg by mouth daily.       Marland Kitchen. losartan (COZAAR) 100 MG tablet       . metoprolol succinate (TOPROL-XL) 100 MG 24 hr tablet       . Multiple Vitamin (MULTIVITAMIN WITH MINERALS) TABS tablet Take 1 tablet by mouth daily.      . pantoprazole (PROTONIX) 40 MG tablet       . predniSONE (DELTASONE) 20 MG tablet Take 3 tablets (60 mg total) by mouth daily with breakfast.  90 tablet  0  . acetaminophen (TYLENOL) 325 MG tablet Take 650 mg by mouth every 4 (four) hours as needed. Pain       . clindamycin (CLEOCIN) 150 MG capsule Take 1 capsule by mouth as needed. dental       No current facility-administered medications for this visit.    Allergies as of 05/29/2014 - Review Complete 05/29/2014  Allergen Reaction Noted  . Bactrim [sulfamethoxazole-tmp ds] Other (See Comments) 06/23/2011  . Penicillins Itching 06/23/2011  . Statins Other (See Comments) 06/23/2011  . Sulfa drugs cross reactors  06/23/2011    Vitals: BP 153/81  Pulse 75  Ht 5' 5.5" (1.664 m)  Wt 229 lb 12.8 oz (104.237 kg)  BMI 37.65 kg/m2 Last Weight:  Wt Readings from Last 1 Encounters:  05/29/14 229 lb 12.8 oz (104.237 kg)   Last Height:   Ht Readings from Last 1 Encounters:  05/29/14 5' 5.5" (1.664 m)    Physical exam:  Exam:  Gen: NAD, conversant, well nourised, obese, well groomed  CV: RRR, no MRG. No Carotid Bruits.  Extremities: 1+ peripheral edema in ankles, warm, nontender  Eyes: Conjunctivae clear without exudates or hemorrhage  Skin: No rash (no gottron's papules, no heliotrope or malar rash)    Neuro:  Detailed Neurologic Exam  Speech:  Speech is normal; fluent and spontaneous with normal comprehension.  Cognition:  The patient is oriented to  person, place, and time;  recent and remote memory intact;  language fluent;  normal attention, concentration,  Intact fund of knowledge  Cranial Nerves:  The pupils are equal, round, and reactive to light. The fundi are normal and spontaneous venous pulsations are present. Visual fields are full to finger confrontation. Extraocular movements are intact. Trigeminal sensation is intact  and the muscles of mastication are normal. The face is symmetric. The palate elevates in the midline. Voice is normal. Shoulder shrug is normal. The tongue has normal motion without fasciculations.   Coordination:  Normal finger to nose and heel to shin.   Gait:  Still cant walk on toes but can almost walk on heels Ambulates with a cane. Waddling gait improved.   Motor Observation:  No asymmetry, no atrophy, and no involuntary movements noted.   Tone:  Normal muscle tone.   Posture:  Posture is erect.   Strength: Got up on the table without using stool Got up out of chair without using chair arms  Biceps and deltoid 4/5 (last 3/5), Triceps 5/5, left wrist extension 4+/5 (previous injury), 4/5 interossei, 4/5 left > right Opponens Pollicis weakness  5- hip flexion otherwise 5/5 lower extremity  5/5 distal finger flexion   Sensation: Intact to LT and pinprick  Reflex Exam:  DTR's:  Trace uppers, absent lowers (may be due to difficult exam secondary to large body habitus)  Toes:  The toes are downgoing bilaterally.  Clonus:  Clonus is absent.    Assessment/Plan: Patient is a lovel 73 year old female who presents with several years of weakness most pronounced going up stairs and brushing hair. Symptoms felt to be severely affecting life and she can't even climb the stairs at church and she is now using a cane to ambulate. Neuro exam significant for proximal, symmetric upper > lower muscle weakness and absence of skin findings. 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   Has  improved on 60mg  of Prednisone a day. She was able to get out of a chair without using her arms, got up on the exam table without using step stool. She is experiencing insomnia, mildly elevated BP, weight gain but denies agitation/mania.   - Can monitor CK as an indicator of disease progression or improvement.  - Discussed prednisone and significant side effects, will have to closely monitor lab values and vitals  - Supportive, hand splints at night for CTS - Continue 60mg  daily until November 8th November 8th: 50mg  daily November 15th: 40mg  daily Follow up the week of November 23rd  Nancy DeanAntonia Maya Arcand, MD  [facility] [address] [place], [place] [address]  Phone [phone] Fax [phone]

## 2014-05-29 NOTE — Patient Instructions (Signed)
Overall you are doing fairly well but I do want to suggest a few things today:   Remember to drink plenty of fluid, eat healthy meals and do not skip any meals. Try to eat protein with a every meal and eat a healthy snack such as fruit or nuts in between meals. Try to keep a regular sleep-wake schedule and try to exercise daily, particularly in the form of walking, 20-30 minutes a day, if you can.   As far as your medications are concerned, I would like to suggest:  Continue 60mg  daily until November 8th November 8th: 50mg  daily November 15th: 40mg  daily Come see me the week of November 23rd and we can see how you are doing.   I would like to see you back in one month, sooner if we need to. Please call us with any interim questions, concerns, problems, updates or refill requests.   My clinical assistant and will answer any of your questions and relay your messages to me and also relay most of my messages to you.   Our phone number is [phone]. We also have an after hours call service for urgent matters and there is a physician on-call for urgent questions. For any emergencies you know to call 911 or go to the nearest emergency room

## 2014-05-30 LAB — BASIC METABOLIC PANEL
BUN / CREAT RATIO: 24 (ref 11–26)
BUN: 17 mg/dL (ref 8–27)
CO2: 24 mmol/L (ref 18–29)
CREATININE: 0.71 mg/dL (ref 0.57–1.00)
Calcium: 10.1 mg/dL (ref 8.7–10.3)
Chloride: 102 mmol/L (ref 97–108)
GFR calc Af Amer: 98 mL/min/{1.73_m2} (ref >59)
GFR calc non Af Amer: 85 mL/min/{1.73_m2} (ref >59)
Glucose: 68 mg/dL (ref 65–99)
POTASSIUM: 4 mmol/L (ref 3.5–5.2)
Sodium: 144 mmol/L (ref 134–144)

## 2014-05-30 LAB — HEMOGLOBIN A1C
Est. average glucose Bld gHb Est-mCnc: 117 mg/dL
Hgb A1c MFr Bld: 5.7 % — ABNORMAL HIGH (ref 4.8–5.6)

## 2014-06-01 ENCOUNTER — Encounter: Payer: Self-pay | Admitting: Neurology

## 2014-06-03 ENCOUNTER — Telehealth: Payer: Self-pay

## 2014-06-03 NOTE — Telephone Encounter (Signed)
[facility] please return call and advise, she can be reached at [phone].

## 2014-06-03 NOTE — Telephone Encounter (Signed)
Message copied by Doree BarthelLOWE, [name] on Wed Jun 03, 2014 11:39 AM ------      Message from: AHERN, ANTONIA B      Created: Tue Jun 02, 2014  8:12 AM       Please let patient know her lab values are stable, they look fine. We will repeat at next appointment. Thank you. ------

## 2014-06-04 NOTE — Telephone Encounter (Signed)
[facility] states that she will soon be in a part of the building where she can't get service.

## 2014-06-04 NOTE — Telephone Encounter (Signed)
Patient is returning a call. Patient can now be reached at [phone] after 3:30 for the next several days.Thank you.

## 2014-06-04 NOTE — Telephone Encounter (Signed)
Called patient.  No answer.

## 2014-06-04 NOTE — Telephone Encounter (Signed)
[facility] please call her back after 10:30 am this morning.

## 2014-06-05 ENCOUNTER — Telehealth: Payer: Self-pay | Admitting: Neurology

## 2014-06-05 DIAGNOSIS — IMO0001 Reserved for inherently not codable concepts without codable children: Secondary | ICD-10-CM

## 2014-06-05 MED ORDER — PREDNISONE 10 MG PO TABS: ORAL_TABLET | ORAL | Status: DC

## 2014-06-05 NOTE — Telephone Encounter (Signed)
Patient returning your call, please call her back.

## 2014-06-05 NOTE — Telephone Encounter (Signed)
Called patient back, no answer

## 2014-06-05 NOTE — Telephone Encounter (Signed)
Spoke to patient. Gave lab results. 

## 2014-06-05 NOTE — Telephone Encounter (Signed)
Patient requesting 10 mg Rx for predniSONE (DELTASONE) 20 MG tablet.  Due to starting taking 50 mg tablets November 8 th, per last OV conversation.  Please call home# [phone] if not available please call cell # [phone].

## 2014-06-05 NOTE — Telephone Encounter (Signed)
I called the patient back on home and cell.  Got no answer on either line.

## 2014-06-05 NOTE — Telephone Encounter (Signed)
Rx has been sent to reflect dose noted in OV.  I called again.  Got no answer on home line.  [facility], so I did not leave message.  Called cell, got no answer.  Left message.

## 2014-06-10 ENCOUNTER — Telehealth: Payer: Self-pay

## 2014-06-10 ENCOUNTER — Encounter: Payer: Self-pay | Admitting: *Deleted

## 2014-06-10 NOTE — Telephone Encounter (Signed)
Called. No answer. No vmail left.

## 2014-06-10 NOTE — Telephone Encounter (Signed)
-----   Message from Anson FretAntonia B Ahern, MD sent at 06/02/2014  8:12 AM EDT ----- Please let patient know her lab values are stable, they look fine. We will repeat at next appointment. Thank you.

## 2014-06-30 ENCOUNTER — Ambulatory Visit (INDEPENDENT_AMBULATORY_CARE_PROVIDER_SITE_OTHER): Payer: Medicare Other | Admitting: Neurology

## 2014-06-30 ENCOUNTER — Encounter: Payer: Self-pay | Admitting: Neurology

## 2014-06-30 VITALS — BP 158/91 | HR 72 | Ht 65.5 in | Wt 234.6 lb

## 2014-06-30 DIAGNOSIS — M3320 Polymyositis, organ involvement unspecified: Secondary | ICD-10-CM

## 2014-06-30 DIAGNOSIS — R5383 Other fatigue: Secondary | ICD-10-CM

## 2014-06-30 MED ORDER — PREDNISONE 20 MG PO TABS: 20.0000 mg | ORAL_TABLET | Freq: Every day | ORAL | Status: DC

## 2014-06-30 MED ORDER — PREDNISONE 5 MG PO TABS: ORAL_TABLET | ORAL | Status: DC

## 2014-06-30 NOTE — Progress Notes (Signed)
[facility]  Provider: Dr Lucia GaskinsAhern Referring Provider: Juline PatchPang, Richard, MD Primary Care Physician: Juline PatchPANG,RICHARD, MD  CC: Polymyositis  HPI: Nancy Sosa is a 73 y.o. female here as a follow up for polymyositis. She is on 40mg  daily of prednisone for polymyositis (was started at 60mg  2 months ago and tirated down to 40mg  ). Today she lifts her cane in the air with one arm and walks into the exam room to show me how much she has improved in regards to her muscle weakness. The insomnia has improved since decreasing the steroids, hasn't used anything to help her sleep. The decrease in the steroids helped with sleeping, was giving her insomnia.  However she is having more SOB, more fatigue. No increased swelling in the feet, not retaining fluid. She is on metoprolol for BP,  home pressures run in 130-150s systolic and doesn't remember the diastolic number. She was seeing a rheumatologist and has not been back since coming here. She is worried that she may have Lupus or other rheumatologist disease as she has read of the association. She was seeing Dr. Azzie RoupShane Anderson as a rheumatologist. She Is feeling much better, able to lift arms overheard and not getting fatigued when washing hair or brushing her teeth. Still uses a cane for longer distances. She goes up the stairs now at church if someone is with her, she "two steps" but wasn't able to do that before. She can get out of low seats. She slipped onto the floor the other day and was able to get up off the floor and pulled herself up. Has gained 11 pounds since starting the prednisone.    Previous Appt 05/29/2014:  She started 60mg  prednisone one month ago. She has gained 6 pounds. She is not sleeping. However she is feeling better and several people at church have noticed she is moving better and have mentioned it to her. She can get out of chairs better. She can get out of a chair without using her arms. Now she is brushing her hair  and arms not getting tired, she can open a jar now which she couldn't do before. She is walking longer distances, she can go to the mailbox and back and she is better. She has walked the stairs at church, she went up and down. She can walk well at the farmers market to all the booths.   She has gained weight. She now has insomnia, She is wide awake at 2am. Sleeping worse. Her blood pressure has increased but only minimally. She feels her CTS has improved, not dropping objects as much.   Review of Systems: Patient complains of symptoms per HPI as well as the following symptoms fatigue, excessive sweating, light sensitivity, blurred vision, excessive thirst, SOB, incontinence and frequency of urination, urgency, headache, numbness, insomnia, nervous/axiety. Pertinent negatives per HPI. All others negative.    History   Social History  . Marital Status: Widowed    Spouse Name: N/A    Number of Children: 0  . Years of Education: 12+   Occupational History  . Retired    Social History Main Topics  . Smoking status: Never Smoker   . Smokeless tobacco: Never Used  . Alcohol Use: No  . Drug Use: No  . Sexual Activity: Not on file   Other Topics Concern  . Not on file   Social History Narrative   Patient lives at home alone.    Patient is right handed.    Patient has  1 deceased child.    Patient is retired.     Family History  Problem Relation Age of Onset  . Neuromuscular disorder Neg Hx   . Neuropathy Neg Hx   . Multiple sclerosis Neg Hx     Past Medical History  Diagnosis Date  . Arthritis   . Asthma     also seasonal allergies/bronchitis  . GERD (gastroesophageal reflux disease)   . Barrett's esophagus   . Hypercholesterolemia   . Cataracts, bilateral   . Varicose veins   . Bursitis of shoulder   . S/P left knee arthroscopy   . S/P right knee arthroscopy   . Hypertension     pt was on benicar hct and metoprolol--but after her knee replacement surg oct 2012--while  in rehab--her b/p stayed so low that the benicar hct was d/c--she still takes the metoprolol  . Dysrhythmia     chronic tachycardia.  nuclear stress test 03/31/11 for preop clearance for TKA --report on this chart from dr. Crawford GivensGanji-no ischemia, occas pvc,.    Past Surgical History  Procedure Laterality Date  . Tonsillectomy    . Knee arthroscopy      bilateral  . Fibroids hand removed    . Fibroids breast removed    . Abdominal hysterectomy    . Cholecystectomy    . Patellar tendon repair  06/26/2011    Procedure: PATELLA TENDON REPAIR;  Surgeon: Loanne DrillingFrank V Aluisio;  Location: [facility];  Service: Orthopedics;  Laterality: Right;  Repair of Extensor Mechanisim  . Fracture surgery      rt wrist orif  . Joint replacement  06/05/11    right total knee arthroplasty  . Left foot bunionectomy    . Patellar tendon repair  07/28/2011    Procedure: PATELLA TENDON REPAIR;  Surgeon: Loanne DrillingFrank V Aluisio;  Location: [facility];  Service: Orthopedics;  Laterality: Right;  . Colonoscopy    . Upper gi endoscopy    . Muscle biopsy Left 03/20/2014    Procedure: LEFT QUADRICEPS MUSCLE BIOPSY;  Surgeon: Adolph Pollackodd J Rosenbower, MD;  Location: [facility];  Service: General;  Laterality: Left;    Current Outpatient Prescriptions  Medication Sig Dispense Refill  . acetaminophen (TYLENOL) 325 MG tablet Take 650 mg by mouth every 4 (four) hours as needed. Pain     . aspirin 81 MG tablet Take 81 mg by mouth daily.      . cholecalciferol (VITAMIN D) 1000 UNITS tablet Take 1,000 Units by mouth daily.     . clindamycin (CLEOCIN) 150 MG capsule Take 1 capsule by mouth as needed. dental    . ezetimibe (ZETIA) 10 MG tablet Take 10 mg by mouth every evening.     . loratadine (CLARITIN) 10 MG tablet Take 10 mg by mouth daily.     Marland Kitchen. losartan (COZAAR) 100 MG tablet     . metoprolol succinate (TOPROL-XL) 100 MG 24 hr tablet     . Multiple Vitamin (MULTIVITAMIN WITH MINERALS) TABS tablet Take 1 tablet by mouth daily.    .  pantoprazole (PROTONIX) 40 MG tablet     . predniSONE (DELTASONE) 10 MG tablet Starting November 8th take 50mg  daily then on November 15th decrease to 40mg  daily as directed 75 tablet 0  . predniSONE (DELTASONE) 20 MG tablet   0  . predniSONE (DELTASONE) 20 MG tablet Take 1 tablet (20 mg total) by mouth daily. 30 tablet 3  . predniSONE (DELTASONE) 5 MG tablet Take as directed with the  20mg  pill daily. 60 tablet 3   No current facility-administered medications for this visit.    Allergies as of 06/30/2014 - Review Complete 06/30/2014  Allergen Reaction Noted  . Bactrim [sulfamethoxazole-trimethoprim] Other (See Comments) 06/23/2011  . Penicillins Itching 06/23/2011  . Statins Other (See Comments) 06/23/2011  . Sulfa drugs cross reactors  06/23/2011    Vitals: BP 158/91 mmHg  Pulse 72  Ht 5' 5.5" (1.664 m)  Wt 234 lb 9.6 oz (106.414 kg)  BMI 38.43 kg/m2 Last Weight:  Wt Readings from Last 1 Encounters:  06/30/14 234 lb 9.6 oz (106.414 kg)   Last Height:   Ht Readings from Last 1 Encounters:  06/30/14 5' 5.5" (1.664 m)   Physical exam:  Exam:  Gen: NAD, conversant, well nourised, obese, well groomed  CV: RRR, no MRG. No Carotid Bruits.  Extremities: 1+ peripheral edema in ankles, warm, nontender  Eyes: Conjunctivae clear without exudates or hemorrhage  Skin: No rash (no gottron's papules, no heliotrope or malar rash)    Neuro:  Detailed Neurologic Exam  Speech:  Speech is normal; fluent and spontaneous with normal comprehension.  Cognition:  The patient is oriented to person, place, and time;  recent and remote memory intact;  language fluent;  normal attention, concentration,  Intact fund of knowledge  Cranial Nerves:  The pupils are equal, round, and reactive to light. The fundi are normal and spontaneous venous pulsations are present. Visual fields are full to finger confrontation. Extraocular movements are intact. Trigeminal sensation is intact and  the muscles of mastication are normal. The face is symmetric. The palate elevates in the midline. Voice is normal. Shoulder shrug is normal. The tongue has normal motion without fasciculations.   Coordination:  Normal finger to nose and heel to shin.   Gait:  Can minimally walk on toes and heels Ambulates with a cane for long distances but can walk the hallway without her cane (improved). Waddling gait improved.  Motor Observation:  No asymmetry, no atrophy, and no involuntary movements noted.   Tone:  Normal muscle tone.   Posture:  Posture is erect.   Strength: Got up on the table without using stool. Lifted her cane over her head with one arm and walked into the exam room to show how much better she feels. Got up out of chair without using chair arms  Biceps and deltoid 4/5 (last 4/5, originally 3/5), Triceps 5/5, left wrist extension 4+/5 (previous injury), 4/5 interossei, 4/5 left > right Opponens Pollicis weakness  5- hip flexion otherwise 5/5 lower extremity  5/5 distal finger flexion  Head extension/flexion 5/5   Sensation: Intact to LT and pinprick  Reflex Exam:  DTR's:  Trace uppers, absent lowers (may be due to difficult exam secondary to large body habitus)  Toes:  The toes are downgoing bilaterally.  Clonus:  Clonus is absent.    Assessment/Plan: Patient is a lovel 73 year old female who originally presented with several years of weakness most pronounced going up stairs and brushing hair. Symptoms felt to be severely affecting life and she can't even climb the stairs at church and she had started using a cane to ambulate. Neuro exam significant for proximal, symmetric upper > lower muscle weakness and absence of skin findings. 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   Has improved on 60mg  of Prednisone a day and has tapered to 40mg  daily. She was able to get out of a chair today without using her arms, got up on  the exam table  without using step stool. She held her cane up over her head with one arm and walked into the exam room to show me how much she has improved.  Her clinical improvement is much better than improvement seen on exam but may take more time on steroids.  She is experiencing insomnia(better since decreasing steroids), has  elevated BP, weight gain but denies agitation/mania. She is also experiencing SOB and fatigue.    - Can monitor CK as an indicator of disease progression or improvement.  - Discussed prednisone and significant side effects, will have to closely monitor lab values and vitals. Repeat CK, CMP today.  - Supportive, hand splints at night for CTS - Prednisone dose should be tapered by 5 mg each week until the patient reaches 20 mg/day. Concerned for side effects of prednisone including weight gain, SOB. Will follow but may need to consider steroid sparing agents - Should also closely follow up with Dr. Azzie Roup who diagnosed patient with Polymyositis. Called today and scheduled an appointment with patient for next Wednesday. Will fax notes to Dr. Dareen Piano and discuss his thoughts on management with steroids vs steroid-sparing agents after her appointment with him given her SOB, increased weight and elevated blood pressure.     Naomie Dean, MD  [facility] [address] [place], [place] [address]  Phone [phone] Fax [phone]

## 2014-06-30 NOTE — Patient Instructions (Addendum)
Overall you are doing fairly well but I do want to suggest a few things today:   Remember to drink plenty of fluid, eat healthy meals and do not skip any meals. Try to eat protein with a every meal and eat a healthy snack such as fruit or nuts in between meals. Try to keep a regular sleep-wake schedule and try to exercise daily, particularly in the form of walking, 20-30 minutes a day, if you can.   As far as your medications are concerned, I would like to suggest:  35 mg for next week 30 mg week two 25mg  daily week three 20mg  daily week four  RTC in 4 - 6 weeks  Will repeat labs as discussed  I would like to see you back in 4- 6 weeks, sooner if we need to. Please call us with any interim questions, concerns, problems, updates or refill requests.   My clinical assistant and will answer any of your questions and relay your messages to me and also relay most of my messages to you.   Appointment with Dr. Azzie RoupShane Anderson Wednesday December 2nd at 9:15am   Our phone number is [phone]. We also have an after hours call service for urgent matters and there is a physician on-call for urgent questions. For any emergencies you know to call 911 or go to the nearest emergency room

## 2014-07-01 LAB — CBC
HCT: 45.9 % (ref 34.0–46.6)
Hemoglobin: 15.5 g/dL (ref 11.1–15.9)
MCH: 29.5 pg (ref 26.6–33.0)
MCHC: 33.8 g/dL (ref 31.5–35.7)
MCV: 87 fL (ref 79–97)
Platelets: 217 10*3/uL (ref 150–379)
RBC: 5.25 x10E6/uL (ref 3.77–5.28)
RDW: 15 % (ref 12.3–15.4)
WBC: 14.3 10*3/uL — AB (ref 3.4–10.8)

## 2014-07-01 LAB — COMPREHENSIVE METABOLIC PANEL
ALK PHOS: 54 IU/L (ref 39–117)
ALT: 48 IU/L — ABNORMAL HIGH (ref 0–32)
AST: 37 IU/L (ref 0–40)
Albumin/Globulin Ratio: 2 (ref 1.1–2.5)
Albumin: 4.2 g/dL (ref 3.5–4.8)
BUN / CREAT RATIO: 26 (ref 11–26)
BUN: 19 mg/dL (ref 8–27)
CALCIUM: 10.2 mg/dL (ref 8.7–10.3)
CO2: 25 mmol/L (ref 18–29)
CREATININE: 0.73 mg/dL (ref 0.57–1.00)
Chloride: 102 mmol/L (ref 97–108)
GFR calc Af Amer: 94 mL/min/{1.73_m2} (ref >59)
GFR calc non Af Amer: 82 mL/min/{1.73_m2} (ref >59)
GLOBULIN, TOTAL: 2.1 g/dL (ref 1.5–4.5)
Glucose: 70 mg/dL (ref 65–99)
POTASSIUM: 3.8 mmol/L (ref 3.5–5.2)
SODIUM: 146 mmol/L — AB (ref 134–144)
Total Bilirubin: 0.5 mg/dL (ref 0.0–1.2)
Total Protein: 6.3 g/dL (ref 6.0–8.5)

## 2014-07-01 LAB — CK: Total CK: 513 U/L (ref 24–173)

## 2014-07-08 ENCOUNTER — Telehealth: Payer: Self-pay | Admitting: Neurology

## 2014-07-08 NOTE — Telephone Encounter (Signed)
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 Wanted to discuss her appointment with Dr. Dareen PianoAnderson. I spoke with him this morning and also faxed over notes and lab tests from our office. Her CK is stable, steroids have not improved the CK despite her clinical improvement. Reviewed biopsy results which showed neurogenic changes, not changes specific for myositis however she was on steroids when the biopsy was ordered by dr. Dareen PianoAnderson back in August. Will call again.

## 2014-07-13 ENCOUNTER — Telehealth: Payer: Self-pay | Admitting: Neurology

## 2014-07-13 NOTE — Telephone Encounter (Signed)
Please see previous note.

## 2014-07-13 NOTE — Telephone Encounter (Signed)
Pataint is calling to give an update on her visit with Dr. Dareen PianoAnderson. Dr. Dareen PianoAnderson is going to start the patient once a week on methotrexate injections starting tomorrow to be taken with folic acid. Patient can be reached until 9:15 today at [phone], after 10:15 can be reached at [phone] and then after 3:30 at [phone]. Patient's cell # is [phone] if you cannot reach at the other numbers. It is ok to leave a message. Thank you.

## 2014-07-20 ENCOUNTER — Other Ambulatory Visit: Payer: Self-pay | Admitting: Neurology

## 2014-08-05 ENCOUNTER — Ambulatory Visit (INDEPENDENT_AMBULATORY_CARE_PROVIDER_SITE_OTHER): Payer: Medicare Other | Admitting: Neurology

## 2014-08-05 ENCOUNTER — Encounter: Payer: Self-pay | Admitting: Neurology

## 2014-08-05 VITALS — BP 106/79 | HR 73 | Temp 99.6°F | Ht 65.5 in | Wt 237.0 lb

## 2014-08-05 DIAGNOSIS — M3320 Polymyositis, organ involvement unspecified: Secondary | ICD-10-CM

## 2014-08-05 MED ORDER — PREDNISONE 2.5 MG PO TABS
ORAL_TABLET | ORAL | Status: DC
Start: 2014-08-05 — End: 2014-12-08

## 2014-08-05 MED ORDER — PREDNISONE 5 MG PO TABS: ORAL_TABLET | ORAL | Status: DC

## 2014-08-05 NOTE — Progress Notes (Signed)
[facility]    Provider:  Dr Lucia GaskinsAhern Referring Provider: Juline PatchPang, Richard, MD Primary Care Physician:  Juline PatchPANG,RICHARD, MD  CC:  Polymyositis  HPI:  Nancy Sosa is a 73 y.o. female here as a referral from Dr. Ricki MillerPang for Polymyositis  08/05/2014: She has been very tired. She is sweating a lot. She was started on Methotrexate last month at Dr. Ewell PoeAnderson's office. She feels better as she goes off the steroid. She can get up the steps at the church. She has pain in the right knee which is inhibiting her progress. She feels like she is getting stronger but it is taking a long time. Things are improved from last month. No known family history of muscle disorders. Her father died at 7393. Mother is 6894. One sister, many cousins and nieces and nephews. She has a dry cough today. She did not get the flu shot, she would not like to. She saw Dr. Ricki MillerPang yesterday.   CK has improved: (from lab results completed at [facility]): 05/21: 1142 06/15: 573 07/24 362 8/21 433 9/18 589 12/23 388   06/30/2014: Nancy Sosa is a 73 y.o. female here as a follow up for polymyositis. She is on 40mg  daily of prednisone for polymyositis (was started at 60mg  2 months ago and tirated down to 40mg  ). Today she lifts her cane in the air with one arm and walks into the exam room to show me how much she has improved in regards to her muscle weakness. The insomnia has improved since decreasing the steroids, hasn't used anything to help her sleep. The decrease in the steroids helped with sleeping, was giving her insomnia. However she is having more SOB, more fatigue. No increased swelling in the feet, not retaining fluid. She is on metoprolol for BP, home pressures run in 130-150s systolic and doesn't remember the diastolic number. She was seeing a rheumatologist and has not been back since coming here. She is worried that she may have Lupus or other rheumatologist disease as she has read of the  association. She was seeing Dr. Azzie RoupShane Anderson as a rheumatologist. She Is feeling much better, able to lift arms overheard and not getting fatigued when washing hair or brushing her teeth. Still uses a cane for longer distances. She goes up the stairs now at church if someone is with her, she "two steps" but wasn't able to do that before. She can get out of low seats. She slipped onto the floor the other day and was able to get up off the floor and pulled herself up. Has gained 11 pounds since starting the prednisone.    05/29/2014:  She started 60mg  prednisone one month ago. She has gained 6 pounds. She is not sleeping. However she is feeling better and several people at church have noticed she is moving better and have mentioned it to her. She can get out of chairs better. She can get out of a chair without using her arms. Now she is brushing her hair and arms not getting tired, she can open a jar now which she couldn't do before. She is walking longer distances, she can go to the mailbox and back and she is better. She has walked the stairs at church, she went up and down. She can walk well at the farmers market to all the booths.   She has gained weight. She now has insomnia, She is wide awake at 2am. Sleeping worse. Her blood pressure has increased but only  minimally. She feels her CTS has improved, not dropping objects as much.   Review of Systems: Patient complains of symptoms per HPI as well as the following symptoms Activity change, flushing, cough, SOB, frequent urination. Pertinent negatives per HPI. All others negative.   History   Social History  . Marital Status: Widowed    Spouse Name: N/A    Number of Children: 0  . Years of Education: 12+   Occupational History  . Retired    Social History Main Topics  . Smoking status: Never Smoker   . Smokeless tobacco: Never Used  . Alcohol Use: No  . Drug Use: No  . Sexual Activity: Not on file   Other Topics Concern  . Not on  file   Social History Narrative   Patient lives at home alone.    Patient is right handed.    Patient has 1 deceased child.    Patient is retired.     Family History  Problem Relation Age of Onset  . Neuromuscular disorder Neg Hx   . Neuropathy Neg Hx   . Multiple sclerosis Neg Hx     Past Medical History  Diagnosis Date  . Arthritis   . Asthma     also seasonal allergies/bronchitis  . GERD (gastroesophageal reflux disease)   . Barrett's esophagus   . Hypercholesterolemia   . Cataracts, bilateral   . Varicose veins   . Bursitis of shoulder   . S/P left knee arthroscopy   . S/P right knee arthroscopy   . Hypertension     pt was on benicar hct and metoprolol--but after her knee replacement surg oct 2012--while in rehab--her b/p stayed so low that the benicar hct was d/c--she still takes the metoprolol  . Dysrhythmia     chronic tachycardia.  nuclear stress test 03/31/11 for preop clearance for TKA --report on this chart from dr. Crawford Givens ischemia, occas pvc,.    Past Surgical History  Procedure Laterality Date  . Tonsillectomy    . Knee arthroscopy      bilateral  . Fibroids hand removed    . Fibroids breast removed    . Abdominal hysterectomy    . Cholecystectomy    . Patellar tendon repair  06/26/2011    Procedure: PATELLA TENDON REPAIR;  Surgeon: Loanne Drilling;  Location: [facility];  Service: Orthopedics;  Laterality: Right;  Repair of Extensor Mechanisim  . Fracture surgery      rt wrist orif  . Joint replacement  06/05/11    right total knee arthroplasty  . Left foot bunionectomy    . Patellar tendon repair  07/28/2011    Procedure: PATELLA TENDON REPAIR;  Surgeon: Loanne Drilling;  Location: [facility];  Service: Orthopedics;  Laterality: Right;  . Colonoscopy    . Upper gi endoscopy    . Muscle biopsy Left 03/20/2014    Procedure: LEFT QUADRICEPS MUSCLE BIOPSY;  Surgeon: Adolph Pollack, MD;  [facility];  Service: General;  Laterality:  Left;    Current Outpatient Prescriptions  Medication Sig Dispense Refill  . acetaminophen (TYLENOL) 325 MG tablet Take 650 mg by mouth every 4 (four) hours as needed. Pain     . aspirin 81 MG tablet Take 81 mg by mouth daily.      . cholecalciferol (VITAMIN D) 1000 UNITS tablet Take 1,000 Units by mouth daily.     . clindamycin (CLEOCIN) 150 MG capsule Take 1 capsule by mouth as needed. dental    .  ezetimibe (ZETIA) 10 MG tablet Take 10 mg by mouth every evening.     . folic acid (FOLVITE) 1 MG tablet Take 1 mg by mouth daily.    Marland Kitchen. loratadine (CLARITIN) 10 MG tablet Take 10 mg by mouth daily.     Marland Kitchen. losartan (COZAAR) 100 MG tablet     . methotrexate 25 MG/ML injection   2  . metoprolol succinate (TOPROL-XL) 100 MG 24 hr tablet     . Multiple Vitamin (MULTIVITAMIN WITH MINERALS) TABS tablet Take 1 tablet by mouth daily.    . pantoprazole (PROTONIX) 40 MG tablet      No current facility-administered medications for this visit.    Allergies as of 08/05/2014 - Review Complete 08/05/2014  Allergen Reaction Noted  . Bactrim [sulfamethoxazole-trimethoprim] Other (See Comments) 06/23/2011  . Penicillins Itching 06/23/2011  . Statins Other (See Comments) 06/23/2011  . Sulfa drugs cross reactors  06/23/2011    Vitals: BP 106/79 mmHg  Pulse 73  Temp(Src) 99.6 F (37.6 C) (Oral)  Ht 5' 5.5" (1.664 m)  Wt 237 lb (107.502 kg)  BMI 38.82 kg/m2 Last Weight:  Wt Readings from Last 1 Encounters:  08/05/14 237 lb (107.502 kg)   Last Height:   Ht Readings from Last 1 Encounters:  08/05/14 5' 5.5" (1.664 m)    Biceps and deltoid 4/5 (last 4/5, originally 3/5), Triceps 5/5, left wrist extension 4+/5 (previous injury), 4/5 interossei, 4/5 left > right Opponens Pollicis weakness  5- hip flexion otherwise 5/5 lower extremity  5/5 distal finger flexion  Head extension/flexion 5/5      Assessment/Plan:  Patient is a lovel 73 year old female who originally presented with several years  of weakness most pronounced going up stairs and brushing hair. Symptoms felt to be severely affecting life and she can't even climb the stairs at church and she had started using a cane to ambulate. Neuro exam significant for proximal, symmetric upper > lower muscle weakness and absence of skin findings. 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   She was started on 60mg  of prednisone a day and titrated to 20mg /day but due to significant side effects she was started on methotrexate. She feels improved however her clinical exam is unchanged.   CK has improved: ([facility]): 05/21: 1142 06/15: 573 07/24 362 8/21 433 9/18 589 12/23 388  Will continue on methotrexate. Can check CK at next appointment. Will check for Pompe disease. If clinical exam doesn't start to improve, will work up for mimics of polymyositis however the improvement in CK is reassuring. Can consider checking for LGMD at next appointment.   A total of 30 minutes was spent in with this patient. Over half this time was spent on counseling patient on the diagnosis and different therapeutic options available.    Naomie DeanAntonia Ariez Neilan, MD  [facility] [address] [place], [place] [id number]  Phone [phone] Fax [phone]

## 2014-08-05 NOTE — Patient Instructions (Addendum)
Overall you are doing fairly well but I do want to suggest a few things today:   Remember to drink plenty of fluid, eat healthy meals and do not skip any meals. Try to eat protein with a every meal and eat a healthy snack such as fruit or nuts in between meals. Try to keep a regular sleep-wake schedule and try to exercise daily, particularly in the form of walking, 20-30 minutes a day, if you can.   As far as your medications are concerned, I would like to suggest: 10mg  prednisone for 3 days 5mg  prednisone for 3 days 2.gmg for 3 days Then stop  I would like to see you back in 8 weeks, sooner if we need to. Please call us with any interim questions, concerns, problems, updates or refill requests.   Please also call us for any test results so we can go over those with you on the phone.  My clinical assistant and will answer any of your questions and relay your messages to me and also relay most of my messages to you.   Our phone number is [phone]. We also have an after hours call service for urgent matters and there is a physician on-call for urgent questions. For any emergencies you know to call 911 or go to the nearest emergency room

## 2014-09-02 ENCOUNTER — Encounter: Payer: Self-pay | Admitting: Neurology

## 2014-10-26 ENCOUNTER — Encounter (HOSPITAL_COMMUNITY): Payer: Self-pay | Admitting: Anesthesiology

## 2014-10-27 ENCOUNTER — Encounter (HOSPITAL_COMMUNITY)
Admission: RE | Disposition: A | Discharge status: Home / Self Care | Payer: Self-pay | Source: Ambulatory Visit | Attending: Cardiology

## 2014-10-27 ENCOUNTER — Encounter (HOSPITAL_COMMUNITY): Payer: Self-pay | Admitting: *Deleted

## 2014-10-27 ENCOUNTER — Ambulatory Visit (HOSPITAL_COMMUNITY)
Admission: RE | Admit: 2014-10-27 | Discharge: 2014-10-27 | Disposition: A | Discharge status: Home / Self Care | Payer: Medicare Other | Source: Ambulatory Visit | Attending: Cardiology | Admitting: Cardiology

## 2014-10-27 DIAGNOSIS — I4891 Unspecified atrial fibrillation: Secondary | ICD-10-CM | POA: Insufficient documentation

## 2014-10-27 DIAGNOSIS — Z539 Procedure and treatment not carried out, unspecified reason: Secondary | ICD-10-CM | POA: Diagnosis not present

## 2014-10-27 SURGERY — CANCELLED PROCEDURE

## 2014-11-30 NOTE — H&P (Signed)
OFFICE VISIT NOTES COPIED TO EPIC FOR DOCUMENTATION   Nancy Sosa 10/12/2014 12:43 PM Location: [facility] Patient #: [id number] DOB: January 05, 1941 Widowed / Language: [name] / Race: Black or African American Female    History of Present Illness(Nancy Sosa, AGNP-C; 10/12/2014 1:33 PM) The patient is a 73 year old female who presents for a follow-up for Atrial fibrillation. Nancy Sosa is a 74 year old African-American female who had presented on a STAT basis for evaluation of new onset atrial fibrillation after presenting to her PCP with complaints of lower extremity edema, shortness of breath, and fatigue. She states the symptoms have been ongoing for over a month and have become progressively worse in the last couple of weeks. She was started on Lasix on Sunday for 5 days which reduced edema but did not resolve it completely. She is relatively inactive and walks with a cane due to joint pain, however states she has been unable to do even routine activities without becoming extremely short of breath over the past several weeks. She is continued on Lasix and spironolactone was added. She was also started on Xarelto for anticoagulation. Digoxin was also prescribed, however she states she never picked this up. She had stress test this morning and is scheduled for echocardiogram next week. She presents for follow-up of blood pressure and edema.  She denies any chest pain, PND, orthopnea, syncope, or symptoms suggestive of claudication or TIA. She states she has significantly changed her diet and her sister is preparing all of her meals for her and is strictly limiting calories and sodium intake. Overall, she states she is beginning to feel much better.     Problem List/Past Medical(Charavina Reader; 10/12/2014 12:52 PM) Acute on chronic diastolic heart failure (Y48.25). Labs 10/02/2014: BUN 12, creatinine 0.9, EGFR 79, sodium 144, potassium 3.1, BNP 476 Essential  hypertension (I10) Edema extremities (R60.0) Shortness of breath at rest (R06.02) Atrial fibrillation, new onset (I48.91). CHA2DS2-VASc Score is 4 with yearly risk of stroke of 4.0%. 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 Itching. Statins. Swelling. Shellfish. Swelling. Sulfa Antibiotics. Itching. Septra *ANTI-INFECTIVE AGENTS - MISC.*. Itching.    Family History(Charavina Reader; 10/12/2014 12:52 PM) Sister 1. In good health. is 4 years younger- has high cholesterol, palpitations Father. Deceased. at age 91 - from CHF, cancer of colon Mother. living- has diabetes, no known heart problems    Social History(Charavina Reader; 10/12/2014 12:52 PM) Non Drinker/No Alcohol Use Current tobacco use. Never smoker. Living Situation. Lives alone. Marital status. Widowed. Number of Children. 1. 1 son deceased at age 18 from a MVA    Past Surgical History(Charavina Reader; 10/12/2014 12:52 PM) Cholecystectomy Hysterectomy; Total. 1980 Knee Surgery with multiple revisions. 2012 Right.    Medication History(Charavina Reader; 10/12/2014 1:04 PM) Lasix (40MG Tablet, 1 (one) Tablet Oral daily, Taken starting 10/02/2014) Active. Losartan Potassium (100MG Tablet, 1 Oral daily) Active. Metoprolol Succinate ER (100MG Tablet ER 24HR, 1 Oral daily at bedtime) Active. Pantoprazole Sodium (40MG Tablet DR, 1 Oral daily) Active. Spironolactone (50MG Tablet, 1 (one) Tablet Oral daily, Taken starting 10/02/2014) Active. Zetia (10MG Tablet, 1 Oral daily) Active. Eliquis (5MG Tablet, 1 (one) Tablet Oral two times daily, Taken starting 10/02/2014) Active. AzaTHIOprine (50MG Tablet, 2 Oral daily)  Active. Aspirin (81MG Tablet, 1 (one) Oral daily) Active. Multiple Vitamin (1 (  one) Oral daily) Active. Magnesium (300MG Capsule, 1 Oral daily with a meal) Active. Calcium Plus (1 Oral daily) Active. Lysine (500MG Capsule, 1 Oral daily as needed) Active. Clindamycin HCl (150MG Capsule, 1 Oral three hours before procedure as needed) Active. Vitamin B 12 (100MCG Lozenge, 1 Oral daily) Active.    Diagnostic Studies History(Charavina Reader; 10/12/2014 12:52 PM) Carlton Adam stress 03/31/11: Mild gut uptake artifact. Normal LVEF. Low risk stress Colonoscopy. " has not been 10 years since the last one" Endoscopy. same times as colonscopy    Review of Systems(Nancy Sosa, AGNP-C; 10/12/2014 1:34 PM) General:Present- Fatigue and Weight Gain > 10lbs.. Not Present- Anorexia and Fever. Respiratory:Present- Decreased Exercise Tolerance, Difficulty Breathing on Exertion and Dyspnea. Not Present- Cough. Cardiovascular:Present- Palpitations. Not Present- Chest Pain, Claudications, Edema, Orthopnea, Paroxysmal Nocturnal Dyspnea and Shortness of Breath. Gastrointestinal:Not Present- Change in Bowel Habits, Constipation and Nausea. Neurological:Not Present- Dizziness, Focal Neurological Symptoms and Syncope. Endocrine:Not Present- Appetite Changes, Cold Intolerance and Heat Intolerance. Hematology:Not Present- Anemia, Petechiae and Prolonged Bleeding.    Vitals(Charavina Reader; 10/12/2014 12:58 PM) 10/12/2014 12:49 PM Weight: 210 lb Height: 65 in Body Surface Area: 2.09 m Body Mass Index: 34.95 kg/m Pulse: 67 (Regular) P.OX: 95% (Room air) BP: 114/80 (Sitting, Left Arm, Standard)     Physical Exam(Nancy Sosa, AGNP-C; 10/12/2014 1:34 PM) The physical exam findings are as follows:   General Mental Status- Alert. General Appearance- Cooperative and Appears stated age. Not in acute distress. Orientation- Oriented X3. Build & Nutrition- Moderately built  and Morbidly obese.   Head and Neck Thyroid Gland Characteristics- no palpable nodules. no palpable enlargement.   Chest and Lung Exam Palpation:Tender- No chest wall tenderness. Auscultation: Adventitious sounds:Inspiratory & expiratory crackles- Both Lung Fields.   Cardiovascular Inspection:Jugular vein- Right- No Distention. Auscultation:Rhythm- Irregularly irregular and Tachycardic. Heart Sounds- S1 is variable, S2 WNL and No gallop present. Murmurs & Other Heart Sounds: Murmur:- No murmur.   Abdomen Palpation/Percussion:Palpation and Percussion of the abdomen reveal - Non Tender and No hepatosplenomegaly. Auscultation:Auscultation of the abdomen reveals - Bowel sounds normal.   Peripheral Vascular Lower Extremity:Inspection- Left- No Pigmentation or Varicose veins. Right- No Pigmentation or Varicose veins. Palpation:Edema- Left- Trace edema. Right- Trace edema. Femoral pulse- Bilateral- Note: unable to palpate due to sitting position Popliteal pulse- Bilateral- Note: unable to palpate Dorsalis pedis pulse- Bilateral- Note: unable to palpate due to edema Posterior tibial pulse- Bilateral- Note: unable to palpate due to edema Carotid arteries- Left- No Carotid bruit. Right- No Carotid bruit. Abdomen- No prominent abdominal aortic pulsation or epigastric bruit.   Neurologic Motor:- Grossly intact without any focal deficits.   Musculoskeletal Global Assessment Left Lower Extremity - normal range of motion without pain. Right Lower Extremity- normal range of motion without pain.    Assessment & Plan(Nancy Sosa, AGNP-C; 10/12/2014 2:33 PM) Atrial fibrillation, new onset (I48.91) Story: CHA2DS2-VASc Score is 4 with yearly risk of stroke of 4.0%. HAS-Bled score is 1 and estimated major bleeding in one year is 1.02-1.5% Impression: EKG 10/12/2014: Atrial fibrillation with rapid ventricular response at a rate of 150 bpm,  normal axis, PVC, diffuse nonspecific T-wave abnormality. Current Plans l Complete electrocardiogram (93000) l Restarted Digoxin 250MCG, 1 (one) Tablet daily, #30, 30 days starting 10/12/2014, No Refill. P/A Approved 10/06/2014 for 1 Year. Future Plans l 10/09/2014: Myocardial perfusion imaging, tomographic (SPECT) (including attenuation correction, qualitative or quantitative wall motion, ejection fraction by first pass or gated technique, additional quantification, when performed) - one time l 10/09/2014: Echocardiography, transthoracic, real-time with image  documentation (2D), includes M-mode recording, when performed, complete, with spectral Doppler echocardiography, and with color flow Doppler echocardiography (55732) - one time  Essential hypertension (I10)  Acute on chronic diastolic heart failure (K02.54) Story: Labs 10/02/2014: BUN 12, creatinine 0.9, EGFR 79, sodium 144, potassium 3.1, BNP 476  Edema extremities (R60.0) Current Plans l Mechanism of underlying disease process and action of medications discussed with the patient. I discussed primary/secondary prevention and also dietary counseling was done. She presents for one-week follow-up of edema, shortness of breath, and atrial fibrillation. Her edema has completely resolved and she has lost 30 pounds in weight since her last visit. She never picked up digoxin and she remains in A. fib with RVR today. This may also be related to diuretics and stress test that was just performed. She is advised to pick up digoxin today and began taking it. She may stop taking Lasix with edema has resolved. Continue spironolactone. Continue all other medications. Scheduled for cardioversion in 2 weeks. I have discussed regarding risks benefits rate control vs rhythm control with the patient. Patient understands cardiac arrest and need for CPR, aspiration pneumonia, but not limited to these. Patient is willing. Positive reinforcement  given in regards to significant dietary changes. Follow-up after cardioversion. 10/12/2014  l Referred to Tommy Medal MD. l Discontinued Lasix 40MG (completed course).    Addendum Note(Jagadeesh Carlynn Herald, MD; 10/14/2014 10:20 PM) I have personally reviewed the patient's record and performed physical exam and agree with the assessment and plan of Ms. Neldon Labella, NP-C.  Laverda Page, MD    Signed electronically by Neldon Labella, AGNP-C (10/12/2014 2:34 PM)

## 2014-12-01 ENCOUNTER — Ambulatory Visit (HOSPITAL_COMMUNITY): Payer: Medicare Other | Admitting: Anesthesiology

## 2014-12-01 ENCOUNTER — Ambulatory Visit (HOSPITAL_COMMUNITY)
Admission: RE | Admit: 2014-12-01 | Discharge: 2014-12-01 | Disposition: A | Discharge status: Home / Self Care | Payer: Medicare Other | Source: Ambulatory Visit | Attending: Cardiology | Admitting: Cardiology

## 2014-12-01 ENCOUNTER — Encounter (HOSPITAL_COMMUNITY)
Admission: RE | Disposition: A | Discharge status: Home / Self Care | Payer: Self-pay | Source: Ambulatory Visit | Attending: Cardiology

## 2014-12-01 ENCOUNTER — Encounter (HOSPITAL_COMMUNITY): Payer: Self-pay | Admitting: *Deleted

## 2014-12-01 DIAGNOSIS — K219 Gastro-esophageal reflux disease without esophagitis: Secondary | ICD-10-CM | POA: Insufficient documentation

## 2014-12-01 DIAGNOSIS — Z881 Allergy status to other antibiotic agents status: Secondary | ICD-10-CM | POA: Diagnosis not present

## 2014-12-01 DIAGNOSIS — J45909 Unspecified asthma, uncomplicated: Secondary | ICD-10-CM | POA: Insufficient documentation

## 2014-12-01 DIAGNOSIS — I4891 Unspecified atrial fibrillation: Secondary | ICD-10-CM | POA: Diagnosis present

## 2014-12-01 DIAGNOSIS — I10 Essential (primary) hypertension: Secondary | ICD-10-CM | POA: Diagnosis not present

## 2014-12-01 DIAGNOSIS — R785 Finding of other psychotropic drug in blood: Secondary | ICD-10-CM | POA: Insufficient documentation

## 2014-12-01 DIAGNOSIS — Z888 Allergy status to other drugs, medicaments and biological substances status: Secondary | ICD-10-CM | POA: Insufficient documentation

## 2014-12-01 DIAGNOSIS — Z91013 Allergy to seafood: Secondary | ICD-10-CM | POA: Diagnosis not present

## 2014-12-01 DIAGNOSIS — I5033 Acute on chronic diastolic (congestive) heart failure: Secondary | ICD-10-CM | POA: Insufficient documentation

## 2014-12-01 DIAGNOSIS — Z9049 Acquired absence of other specified parts of digestive tract: Secondary | ICD-10-CM | POA: Insufficient documentation

## 2014-12-01 DIAGNOSIS — Z880 Allergy status to penicillin: Secondary | ICD-10-CM | POA: Insufficient documentation

## 2014-12-01 DIAGNOSIS — E669 Obesity, unspecified: Secondary | ICD-10-CM | POA: Insufficient documentation

## 2014-12-01 DIAGNOSIS — Z6834 Body mass index (BMI) 34.0-34.9, adult: Secondary | ICD-10-CM | POA: Diagnosis not present

## 2014-12-01 HISTORY — PX: CARDIOVERSION: SHX1299

## 2014-12-01 LAB — POCT I-STAT 4, (NA,K, GLUC, HGB,HCT)
GLUCOSE: 90 mg/dL (ref 70–99)
HCT: 46 % (ref 36.0–46.0)
Hemoglobin: 15.6 g/dL — ABNORMAL HIGH (ref 12.0–15.0)
Potassium: 3.1 mmol/L — ABNORMAL LOW (ref 3.5–5.1)
SODIUM: 143 mmol/L (ref 135–145)

## 2014-12-01 SURGERY — CARDIOVERSION
Anesthesia: General

## 2014-12-01 MED ORDER — HYDRALAZINE HCL 20 MG/ML IJ SOLN
10.0000 mg | Freq: Once | INTRAMUSCULAR | Status: AC
  Administered 2014-12-01: 10 mg via INTRAVENOUS

## 2014-12-01 MED ORDER — POTASSIUM CHLORIDE CRYS ER 20 MEQ PO TBCR
20.0000 meq | EXTENDED_RELEASE_TABLET | Freq: Once | ORAL | Status: AC
  Administered 2014-12-01: 20 meq via ORAL
  Filled 2014-12-01: qty 1

## 2014-12-01 MED ORDER — METOPROLOL TARTRATE 1 MG/ML IV SOLN
INTRAVENOUS | Status: AC
  Filled 2014-12-01: qty 5

## 2014-12-01 MED ORDER — METOPROLOL SUCCINATE ER 100 MG PO TB24: 100.0000 mg | ORAL_TABLET | Freq: Two times a day (BID) | ORAL | Status: DC

## 2014-12-01 MED ORDER — LIDOCAINE HCL (CARDIAC) 20 MG/ML IV SOLN
INTRAVENOUS | Status: DC | PRN
  Administered 2014-12-01: 20 mg via INTRAVENOUS

## 2014-12-01 MED ORDER — SODIUM CHLORIDE 0.9 % IV SOLN
INTRAVENOUS | Status: DC
  Administered 2014-12-01: 500 mL via INTRAVENOUS

## 2014-12-01 MED ORDER — HYDRALAZINE HCL 20 MG/ML IJ SOLN
INTRAMUSCULAR | Status: AC
  Filled 2014-12-01: qty 1

## 2014-12-01 MED ORDER — METOPROLOL TARTRATE 1 MG/ML IV SOLN
5.0000 mg | Freq: Once | INTRAVENOUS | Status: AC
  Administered 2014-12-01: 5 mg via INTRAVENOUS

## 2014-12-01 MED ORDER — PROPOFOL 10 MG/ML IV BOLUS
INTRAVENOUS | Status: DC | PRN
  Administered 2014-12-01: 60 mg via INTRAVENOUS

## 2014-12-01 NOTE — Transfer of Care (Signed)
Immediate Anesthesia Transfer of Care Note  Patient: Nancy Sosa  Procedure(s) Performed: Procedure(s): CARDIOVERSION (N/A)  Patient Location: Endoscopy Unit  Anesthesia Type:General  Level of Consciousness: awake, alert  and oriented  Airway & Oxygen Therapy: Patient Spontanous Breathing and Patient connected to nasal cannula oxygen  Post-op Assessment: Report given to RN and Post -op Vital signs reviewed and stable  Post vital signs: Reviewed and stable  Last Vitals:  Filed Vitals:   12/01/14 1120  BP:   Temp: 36.7 C  Resp:     Complications: No apparent anesthesia complications

## 2014-12-01 NOTE — Interval H&P Note (Signed)
History and Physical Interval Note:  12/01/2014 12:18 PM  Nancy Sosa  has presented today for surgery, with the diagnosis of afib  The various methods of treatment have been discussed with the patient and family. After consideration of risks, benefits and other options for treatment, the patient has consented to  Procedure(s): CARDIOVERSION (N/A) as a surgical intervention .  The patient's history has been reviewed, patient examined, no change in status, stable for surgery.  I have reviewed the patient's chart and labs.  Questions were answered to the patient's satisfaction.     Pamella PertGANJI,JAGADEESH R

## 2014-12-01 NOTE — CV Procedure (Signed)
Direct current cardioversion:  Indication symptomatic A. Fibrillation.  Procedure: Using 60 mg of IV Propofol and 30 IV Lidocaine (for reducing venous pain) for achieving deep sedation, synchronized direct current cardioversion performed. Patient was delivered with 120, 150, 200 Joules of electricity with success to NSR. Patient tolerated the procedure well. No immediate complication noted.   Patient has had normal LVEF and normal nuclear stress in March 2016.

## 2014-12-01 NOTE — Discharge Instructions (Signed)

## 2014-12-01 NOTE — Interval H&P Note (Signed)
History and Physical Interval Note:  12/01/2014 12:19 PM  Nancy CheersLouise M Guirguis  has presented today for surgery, with the diagnosis of afib  The various methods of treatment have been discussed with the patient and family. After consideration of risks, benefits and other options for treatment, the patient has consented to  Procedure(s): CARDIOVERSION (N/A) as a surgical intervention .  The patient's history has been reviewed, patient examined, no change in status, stable for surgery.  I have reviewed the patient's chart and labs.  Questions were answered to the patient's satisfaction.     Pamella PertGANJI,JAGADEESH R

## 2014-12-01 NOTE — Anesthesia Postprocedure Evaluation (Signed)
  Anesthesia Post-op Note  Patient: Nancy Sosa  Procedure(s) Performed: Procedure(s): CARDIOVERSION (N/A)  Patient Location: PACU  Anesthesia Type:General  Level of Consciousness: awake, alert  and oriented  Airway and Oxygen Therapy: Patient Spontanous Breathing and Patient connected to nasal cannula oxygen  Post-op Pain: none  Post-op Assessment: Post-op Vital signs reviewed, Patient's Cardiovascular Status Stable, Respiratory Function Stable, Patent Airway, No signs of Nausea or vomiting and Pain level controlled  Post-op Vital Signs: Reviewed and stable  Last Vitals:  Filed Vitals:   12/01/14 1120  BP:   Temp: 36.7 C  Resp:     Complications: No apparent anesthesia complications

## 2014-12-01 NOTE — Anesthesia Preprocedure Evaluation (Addendum)
Anesthesia Evaluation  Patient identified by MRN, date of birth, ID band Patient awake    Reviewed: Allergy & Precautions, NPO status , Patient's Chart, lab work & pertinent test results  History of Anesthesia Complications Negative for: history of anesthetic complications  Airway Mallampati: II  TM Distance: >3 FB Neck ROM: Full    Dental  (+) Caps, Dental Advisory Given   Pulmonary asthma ,  breath sounds clear to auscultation        Cardiovascular hypertension, Pt. on medications and Pt. on home beta blockers - angina+ dysrhythmias Atrial Fibrillation Rhythm:Irregular Rate:Normal  3/16 stress test: normal LVF and perfusion   Neuro/Psych    GI/Hepatic Neg liver ROS, GERD-  Medicated and Controlled,  Endo/Other  Morbid obesity  Renal/GU      Musculoskeletal  (+) Arthritis -,   Abdominal (+) + obese,   Peds  Hematology  (+) Blood dyscrasia (eliquis), ,   Anesthesia Other Findings   Reproductive/Obstetrics                           Anesthesia Physical Anesthesia Plan  ASA: III  Anesthesia Plan: General   Post-op Pain Management:    Induction: Intravenous  Airway Management Planned: Mask and Natural Airway  Additional Equipment:   Intra-op Plan:   Post-operative Plan:   Informed Consent: I have reviewed the patients History and Physical, chart, labs and discussed the procedure including the risks, benefits and alternatives for the proposed anesthesia with the patient or authorized representative who has indicated his/her understanding and acceptance.   Dental advisory given  Plan Discussed with: CRNA and Surgeon  Anesthesia Plan Comments: (Plan routine monitors, GA for cardioversion )       Anesthesia Quick Evaluation

## 2014-12-02 ENCOUNTER — Encounter (HOSPITAL_COMMUNITY): Payer: Self-pay | Admitting: Cardiology

## 2014-12-08 ENCOUNTER — Encounter: Payer: Self-pay | Admitting: Podiatry

## 2014-12-08 ENCOUNTER — Ambulatory Visit (INDEPENDENT_AMBULATORY_CARE_PROVIDER_SITE_OTHER): Payer: Medicare Other | Admitting: Podiatry

## 2014-12-08 VITALS — BP 162/101 | HR 69 | Ht 65.0 in | Wt 210.0 lb

## 2014-12-08 DIAGNOSIS — M79606 Pain in leg, unspecified: Secondary | ICD-10-CM | POA: Insufficient documentation

## 2014-12-08 DIAGNOSIS — B351 Tinea unguium: Secondary | ICD-10-CM | POA: Diagnosis not present

## 2014-12-08 NOTE — Patient Instructions (Signed)
Seen for painful corns and toe nails. All debrided and padded on 2nd toe right. Need to return in one month for follow up on pre ulcerative corn on 2nd toe right.

## 2014-12-08 NOTE — Progress Notes (Signed)
Subjective: 74 y.o. year old female patient presents complaining of pain on both feet.  Patient requests toe nails, corns and calluses trimmed.   Objective: Dermatologic: Thick yellow deformed nails with pain on 4th digit left. Severe pain over 2nd digit right with digital corn. Vascular: Pedal pulses are all palpable. Orthopedic: Contracted lesser digits  Neurologic: All epicritic and tactile sensations grossly intact.  Assessment: Dystrophic mycotic nails x 10, with pain. Painful corn 2nd right, pre ulcerative.  Treatment: All mycotic nails, corns, calluses debrided.  Padded 2nd right after debridement. Sample dispensed.  Return in one month.

## 2015-01-08 ENCOUNTER — Ambulatory Visit (INDEPENDENT_AMBULATORY_CARE_PROVIDER_SITE_OTHER): Payer: Medicare Other | Admitting: Podiatry

## 2015-01-08 ENCOUNTER — Encounter: Payer: Self-pay | Admitting: Podiatry

## 2015-01-08 VITALS — BP 167/131 | HR 71

## 2015-01-08 DIAGNOSIS — M79604 Pain in right leg: Secondary | ICD-10-CM

## 2015-01-08 DIAGNOSIS — M2041 Other hammer toe(s) (acquired), right foot: Secondary | ICD-10-CM | POA: Diagnosis not present

## 2015-01-08 NOTE — Progress Notes (Signed)
Subjective: 74 y.o. year old female patient presents for follow up on pre ulcerative corn 2nd right.   Objective: Dermatologic: Thick digital corn over 2nd digit right deeply imbedded. Vascular: Pedal pulses are all palpable. Orthopedic: Contracted lesser digits  Neurologic: All epicritic and tactile sensations grossly intact.  Assessment: Painful corn 2nd right, pre ulcerative.  Treatment: Lesion debrided and padded 2nd digit right. Return in one month to repeat.

## 2015-01-08 NOTE — Patient Instructions (Signed)
Debrided pre ulcerative lesion 2nd toe right. Repeat BP check at home and contact PCP if stays up. Return in 1 month.

## 2015-02-05 ENCOUNTER — Encounter: Payer: Self-pay | Admitting: Podiatry

## 2015-02-05 ENCOUNTER — Ambulatory Visit (INDEPENDENT_AMBULATORY_CARE_PROVIDER_SITE_OTHER): Payer: Medicare Other | Admitting: Podiatry

## 2015-02-05 VITALS — BP 154/82 | HR 56

## 2015-02-05 DIAGNOSIS — M79606 Pain in leg, unspecified: Secondary | ICD-10-CM | POA: Diagnosis not present

## 2015-02-05 DIAGNOSIS — B351 Tinea unguium: Secondary | ICD-10-CM

## 2015-02-05 NOTE — Progress Notes (Signed)
Subjective: 74 y.o. year old female patient presents complaining of pain on both feet.  Patient requests toe nails, corns and calluses trimmed.  Patient will have left knee surgery once she gets medical clearance.   Objective: Dermatologic: Thick yellow deformed nails with pain on 4th digit left. Severe pain over 2nd digit right with digital corn. Vascular: Pedal pulses are all palpable. Orthopedic: Contracted lesser digits  Neurologic: All epicritic and tactile sensations grossly intact.  Assessment: Dystrophic mycotic nails x 10, with pain. Painful corn 2nd right, pre ulcerative.  Treatment: All mycotic nails, corns, calluses debrided.  Padded 2nd right after debridement.  May benefit from Flexor tenotomy of 2nd digit right.  Return in one month.

## 2015-02-05 NOTE — Patient Instructions (Signed)
Seen for painful toe and nails. All debrided and padded 2nd toe right. Return for Flexor tenotomy for 2nd toe right.

## 2015-02-09 NOTE — Telephone Encounter (Signed)
error 

## 2015-02-12 ENCOUNTER — Encounter: Payer: Self-pay | Admitting: Podiatry

## 2015-02-12 ENCOUNTER — Ambulatory Visit (INDEPENDENT_AMBULATORY_CARE_PROVIDER_SITE_OTHER): Payer: Medicare Other | Admitting: Podiatry

## 2015-02-12 VITALS — Ht 65.0 in | Wt 210.0 lb

## 2015-02-12 DIAGNOSIS — M2041 Other hammer toe(s) (acquired), right foot: Secondary | ICD-10-CM | POA: Diagnosis not present

## 2015-02-12 DIAGNOSIS — M79671 Pain in right foot: Secondary | ICD-10-CM

## 2015-02-12 DIAGNOSIS — M79604 Pain in right leg: Secondary | ICD-10-CM

## 2015-02-12 NOTE — Patient Instructions (Signed)
Flexor tenotomy done on 2nd toe right. Keep the bandage dry. Return coming Wednesday.

## 2015-02-12 NOTE — Progress Notes (Signed)
74 year old female presents for scheduled office surgery, flexor tenotomy 2nd right. Local anesthetics used with 50/50 mixture of 0.5% Marcaine plain and 1% Xylocaine with epinephrine total 5 ml.  Right foot prepped with Iodine. Flexor tendon and PIPJ capsule was release with a stab incision using #15 blade followed with 5/0 Nylon skin suture. Patient tolerated procedure well. Home care instruction given. Will see her again coming Wednesday.

## 2015-02-17 ENCOUNTER — Encounter: Payer: Self-pay | Admitting: Podiatry

## 2015-02-17 ENCOUNTER — Encounter: Payer: Medicare Other | Admitting: Podiatry

## 2015-02-17 ENCOUNTER — Ambulatory Visit (INDEPENDENT_AMBULATORY_CARE_PROVIDER_SITE_OTHER): Payer: Medicare Other | Admitting: Podiatry

## 2015-02-17 DIAGNOSIS — M2041 Other hammer toe(s) (acquired), right foot: Secondary | ICD-10-CM

## 2015-02-17 NOTE — Progress Notes (Signed)
Post op wound 2nd toe flexor tenotomy. Wound healed well. Suture removed. Placed in Mefix tape. Return as needed.,

## 2015-02-17 NOTE — Patient Instructions (Signed)
Post op toe 2nd right. Doing well. Suture removed. Return in 3 months.

## 2015-03-22 ENCOUNTER — Other Ambulatory Visit: Payer: Self-pay

## 2015-03-22 DIAGNOSIS — Z1231 Encounter for screening mammogram for malignant neoplasm of breast: Secondary | ICD-10-CM

## 2015-04-30 ENCOUNTER — Ambulatory Visit: Payer: Medicare Other

## 2015-05-20 ENCOUNTER — Ambulatory Visit (INDEPENDENT_AMBULATORY_CARE_PROVIDER_SITE_OTHER): Payer: Medicare Other | Admitting: Podiatry

## 2015-05-20 ENCOUNTER — Encounter: Payer: Self-pay | Admitting: Podiatry

## 2015-05-20 VITALS — BP 157/110 | HR 61

## 2015-05-20 DIAGNOSIS — M79606 Pain in leg, unspecified: Secondary | ICD-10-CM

## 2015-05-20 DIAGNOSIS — B351 Tinea unguium: Secondary | ICD-10-CM

## 2015-05-20 NOTE — Patient Instructions (Signed)
Seen for hypertrophic nails. All nails debrided. Return in 3 months or as needed.  

## 2015-05-20 NOTE — Progress Notes (Signed)
Subjective: 74 y.o. year old female patient presents complaining of pain on both feet. Right foot hurts from corn on top.  Patient requests toe nails, corns and calluses trimmed.  Her blood pressure was up today. She will record her reading for a week and will contact her PCP to adjust her medication.   Objective: Dermatologic: Thick yellow deformed nails with pain on 4th digit left.  Severe pain over 2nd digit right with digital corn. Vascular: Pedal pulses are all palpable. Orthopedic: Contracted lesser digits  Neurologic: All epicritic and tactile sensations grossly intact.  Assessment: Dystrophic mycotic nails x 10, with pain. Painful corn 2nd right, pre ulcerative.  Treatment: All mycotic nails, corns, calluses debrided.  Corn pad dispensed for 2nd right after debridement.  Return in 3 month.

## 2015-05-21 ENCOUNTER — Ambulatory Visit: Payer: Medicare Other | Admitting: Podiatry

## 2015-05-24 ENCOUNTER — Ambulatory Visit
Admission: RE | Admit: 2015-05-24 | Discharge: 2015-05-24 | Disposition: A | Discharge status: Home / Self Care | Payer: Medicare Other | Source: Ambulatory Visit

## 2015-05-24 DIAGNOSIS — Z1231 Encounter for screening mammogram for malignant neoplasm of breast: Secondary | ICD-10-CM

## 2015-08-19 ENCOUNTER — Encounter: Payer: Self-pay | Admitting: Podiatry

## 2015-08-19 ENCOUNTER — Ambulatory Visit (INDEPENDENT_AMBULATORY_CARE_PROVIDER_SITE_OTHER): Payer: Medicare Other | Admitting: Podiatry

## 2015-08-19 VITALS — BP 136/86 | HR 79

## 2015-08-19 DIAGNOSIS — B351 Tinea unguium: Secondary | ICD-10-CM | POA: Diagnosis not present

## 2015-08-19 DIAGNOSIS — M79606 Pain in leg, unspecified: Secondary | ICD-10-CM | POA: Diagnosis not present

## 2015-08-19 NOTE — Progress Notes (Signed)
Subjective: 75 y.o. year old female patient presents complaining of pain on both feet. Patient ambulate assisted with a cane.  Right foot hurts from corn on the 2nd toe.  Patient requests toe nails, corns and calluses trimmed.  Patient feels she can use a diabetic boots.  Objective: Dermatologic: Thick yellow deformed nails with pain on 4th digit left.  Severe pain over 2nd digit right with digital corn. Vascular: Pedal pulses are all palpable. Orthopedic: Contracted lesser digits  Neurologic: All epicritic and tactile sensations grossly intact.  Assessment: Dystrophic mycotic nails x 10, with pain. Painful corn 2nd right, pre ulcerative.  Treatment: All mycotic nails, corns, calluses debrided.  Corn pad dispensed for 2nd right after debridement.  Return in 3 month.

## 2015-08-19 NOTE — Patient Instructions (Signed)
Seen for hypertrophic nails. All nails debrided. Return in 3 months or as needed.  

## 2015-08-30 ENCOUNTER — Other Ambulatory Visit: Payer: Self-pay | Admitting: Internal Medicine

## 2015-08-30 DIAGNOSIS — N6320 Unspecified lump in the left breast, unspecified quadrant: Secondary | ICD-10-CM

## 2015-09-02 ENCOUNTER — Other Ambulatory Visit: Payer: Medicare Other

## 2015-09-03 ENCOUNTER — Ambulatory Visit
Admission: RE | Admit: 2015-09-03 | Discharge: 2015-09-03 | Disposition: A | Discharge status: Home / Self Care | Payer: Medicare Other | Source: Ambulatory Visit | Attending: Internal Medicine | Admitting: Internal Medicine

## 2015-09-03 DIAGNOSIS — N6320 Unspecified lump in the left breast, unspecified quadrant: Secondary | ICD-10-CM

## 2015-09-10 DIAGNOSIS — M3320 Polymyositis, organ involvement unspecified: Secondary | ICD-10-CM | POA: Diagnosis not present

## 2015-09-30 DIAGNOSIS — M3320 Polymyositis, organ involvement unspecified: Secondary | ICD-10-CM | POA: Diagnosis not present

## 2015-09-30 DIAGNOSIS — Z79899 Other long term (current) drug therapy: Secondary | ICD-10-CM | POA: Diagnosis not present

## 2015-10-29 DIAGNOSIS — M3320 Polymyositis, organ involvement unspecified: Secondary | ICD-10-CM | POA: Diagnosis not present

## 2015-10-29 DIAGNOSIS — Z79899 Other long term (current) drug therapy: Secondary | ICD-10-CM | POA: Diagnosis not present

## 2015-11-12 DIAGNOSIS — I10 Essential (primary) hypertension: Secondary | ICD-10-CM | POA: Diagnosis not present

## 2015-11-12 DIAGNOSIS — Z6833 Body mass index (BMI) 33.0-33.9, adult: Secondary | ICD-10-CM | POA: Diagnosis not present

## 2015-11-12 DIAGNOSIS — I482 Chronic atrial fibrillation: Secondary | ICD-10-CM | POA: Diagnosis not present

## 2015-11-17 ENCOUNTER — Ambulatory Visit (INDEPENDENT_AMBULATORY_CARE_PROVIDER_SITE_OTHER): Payer: Medicare Other | Admitting: Podiatry

## 2015-11-17 ENCOUNTER — Encounter: Payer: Self-pay | Admitting: Podiatry

## 2015-11-17 DIAGNOSIS — M79606 Pain in leg, unspecified: Secondary | ICD-10-CM | POA: Diagnosis not present

## 2015-11-17 DIAGNOSIS — B351 Tinea unguium: Secondary | ICD-10-CM

## 2015-11-17 NOTE — Progress Notes (Signed)
Subjective: 74 y.o. year old female patient presents complaining of pain on both feet.  Right foot hurts from corn on the 2nd toe.  Patient requests toe nails, corns and calluses trimmed.   Objective: Dermatologic: Thick yellow deformed nails with pain on 4th digit left.  Severe pain over 2nd digit right with digital corn. Vascular: Pedal pulses are all palpable. Orthopedic: Contracted lesser digits  Neurologic: All epicritic and tactile sensations grossly intact.  Assessment: Dystrophic mycotic nails x 10, with pain. Painful corn 2nd right, pre ulcerative.  Treatment: All mycotic nails, corns, calluses debrided.  Corn pad dispensed for 2nd right after debridement.  Return in 3 month. 

## 2015-11-17 NOTE — Patient Instructions (Signed)
Seen for hypertrophic nails. All nails debrided. Return in 3 months or as needed.  

## 2016-02-02 DIAGNOSIS — I10 Essential (primary) hypertension: Secondary | ICD-10-CM | POA: Diagnosis not present

## 2016-02-04 DIAGNOSIS — M3320 Polymyositis, organ involvement unspecified: Secondary | ICD-10-CM | POA: Diagnosis not present

## 2016-02-04 DIAGNOSIS — Z79899 Other long term (current) drug therapy: Secondary | ICD-10-CM | POA: Diagnosis not present

## 2016-02-07 DIAGNOSIS — H401122 Primary open-angle glaucoma, left eye, moderate stage: Secondary | ICD-10-CM | POA: Diagnosis not present

## 2016-02-07 DIAGNOSIS — H401111 Primary open-angle glaucoma, right eye, mild stage: Secondary | ICD-10-CM | POA: Diagnosis not present

## 2016-02-11 DIAGNOSIS — Z6833 Body mass index (BMI) 33.0-33.9, adult: Secondary | ICD-10-CM | POA: Diagnosis not present

## 2016-02-11 DIAGNOSIS — I10 Essential (primary) hypertension: Secondary | ICD-10-CM | POA: Diagnosis not present

## 2016-02-11 DIAGNOSIS — I482 Chronic atrial fibrillation: Secondary | ICD-10-CM | POA: Diagnosis not present

## 2016-02-16 ENCOUNTER — Encounter: Payer: Self-pay | Admitting: Podiatry

## 2016-02-16 ENCOUNTER — Ambulatory Visit (INDEPENDENT_AMBULATORY_CARE_PROVIDER_SITE_OTHER): Payer: Medicare Other | Admitting: Podiatry

## 2016-02-16 VITALS — BP 136/93 | HR 83

## 2016-02-16 DIAGNOSIS — B351 Tinea unguium: Secondary | ICD-10-CM

## 2016-02-16 DIAGNOSIS — M2041 Other hammer toe(s) (acquired), right foot: Secondary | ICD-10-CM | POA: Diagnosis not present

## 2016-02-16 DIAGNOSIS — M79606 Pain in leg, unspecified: Secondary | ICD-10-CM | POA: Diagnosis not present

## 2016-02-16 NOTE — Progress Notes (Signed)
Subjective: [age] y.o. year old female patient presents complaining of pain on both feet.  Right foot hurts from corn on the 2nd toe.  Patient requests toe nails, corns and calluses trimmed.   Objective: Dermatologic: Thick yellow deformed nails with pain on 4th digit left.  Severe pain over 2nd digit right with digital corn. Vascular: Pedal pulses are all palpable. Orthopedic: Contracted lesser digits  Neurologic: All epicritic and tactile sensations grossly intact.  Assessment: Dystrophic mycotic nails x 10, with pain. Painful corn 2nd right, pre ulcerative.  Treatment: All mycotic nails, corns, calluses debrided.  Corn pad dispensed for 2nd right after debridement.  Return in 3 month.

## 2016-02-16 NOTE — Patient Instructions (Signed)
Seen for hypertrophic nails. All nails debrided. Return in 3 months or as needed.  

## 2016-02-21 DIAGNOSIS — M3320 Polymyositis, organ involvement unspecified: Secondary | ICD-10-CM | POA: Diagnosis not present

## 2016-02-21 DIAGNOSIS — I10 Essential (primary) hypertension: Secondary | ICD-10-CM | POA: Diagnosis not present

## 2016-02-21 DIAGNOSIS — E559 Vitamin D deficiency, unspecified: Secondary | ICD-10-CM | POA: Diagnosis not present

## 2016-02-28 DIAGNOSIS — E7800 Pure hypercholesterolemia, unspecified: Secondary | ICD-10-CM | POA: Diagnosis not present

## 2016-02-28 DIAGNOSIS — R748 Abnormal levels of other serum enzymes: Secondary | ICD-10-CM | POA: Diagnosis not present

## 2016-02-28 DIAGNOSIS — I480 Paroxysmal atrial fibrillation: Secondary | ICD-10-CM | POA: Diagnosis not present

## 2016-02-28 DIAGNOSIS — I10 Essential (primary) hypertension: Secondary | ICD-10-CM | POA: Diagnosis not present

## 2016-02-28 DIAGNOSIS — Z0000 Encounter for general adult medical examination without abnormal findings: Secondary | ICD-10-CM | POA: Diagnosis not present

## 2016-03-13 DIAGNOSIS — M0570 Rheumatoid arthritis with rheumatoid factor of unspecified site without organ or systems involvement: Secondary | ICD-10-CM | POA: Diagnosis not present

## 2016-03-13 DIAGNOSIS — K219 Gastro-esophageal reflux disease without esophagitis: Secondary | ICD-10-CM | POA: Diagnosis not present

## 2016-03-13 DIAGNOSIS — Z1211 Encounter for screening for malignant neoplasm of colon: Secondary | ICD-10-CM | POA: Diagnosis not present

## 2016-03-13 DIAGNOSIS — I10 Essential (primary) hypertension: Secondary | ICD-10-CM | POA: Diagnosis not present

## 2016-04-04 DIAGNOSIS — K635 Polyp of colon: Secondary | ICD-10-CM | POA: Diagnosis not present

## 2016-04-04 DIAGNOSIS — D123 Benign neoplasm of transverse colon: Secondary | ICD-10-CM | POA: Diagnosis not present

## 2016-04-04 DIAGNOSIS — K5730 Diverticulosis of large intestine without perforation or abscess without bleeding: Secondary | ICD-10-CM | POA: Diagnosis not present

## 2016-04-04 DIAGNOSIS — Z1211 Encounter for screening for malignant neoplasm of colon: Secondary | ICD-10-CM | POA: Diagnosis not present

## 2016-05-08 ENCOUNTER — Other Ambulatory Visit: Payer: Self-pay | Admitting: Internal Medicine

## 2016-05-08 DIAGNOSIS — Z1231 Encounter for screening mammogram for malignant neoplasm of breast: Secondary | ICD-10-CM

## 2016-05-18 ENCOUNTER — Ambulatory Visit: Payer: Medicare Other | Admitting: Podiatry

## 2016-05-19 DIAGNOSIS — Z6837 Body mass index (BMI) 37.0-37.9, adult: Secondary | ICD-10-CM | POA: Diagnosis not present

## 2016-05-19 DIAGNOSIS — I482 Chronic atrial fibrillation: Secondary | ICD-10-CM | POA: Diagnosis not present

## 2016-05-19 DIAGNOSIS — I10 Essential (primary) hypertension: Secondary | ICD-10-CM | POA: Diagnosis not present

## 2016-05-25 ENCOUNTER — Ambulatory Visit (INDEPENDENT_AMBULATORY_CARE_PROVIDER_SITE_OTHER): Payer: Medicare Other | Admitting: Podiatry

## 2016-05-25 ENCOUNTER — Encounter: Payer: Self-pay | Admitting: Podiatry

## 2016-05-25 VITALS — BP 135/96 | HR 100

## 2016-05-25 DIAGNOSIS — B351 Tinea unguium: Secondary | ICD-10-CM | POA: Diagnosis not present

## 2016-05-25 DIAGNOSIS — M79673 Pain in unspecified foot: Secondary | ICD-10-CM

## 2016-05-25 DIAGNOSIS — M79672 Pain in left foot: Secondary | ICD-10-CM

## 2016-05-25 DIAGNOSIS — M79671 Pain in right foot: Secondary | ICD-10-CM

## 2016-05-25 NOTE — Patient Instructions (Signed)
Seen for hypertrophic nails. All nails debrided. Return in 3 months or as needed.  

## 2016-05-25 NOTE — Progress Notes (Signed)
Subjective: 75 y.o. year old female patient presents complaining of pain on both feet from corns, calluses and thick nails.   Objective: Dermatologic: Thick yellow deformed nails x 10. Excess keratotic tissue build up at distal ungual labia. P over 2nd digit right with digital corn at PIPJ dorsum. Vascular: Pedal pulses are all palpable. Orthopedic: Contracted lesser digits  Neurologic: All epicritic and tactile sensations grossly intact.  Assessment: Dystrophic mycotic nails x 10, with pain. Painful corn 2nd right.  Treatment: All mycotic nails, corns, calluses debrided.  Bleeding 2nd digital corn dressed with Amerigel ointment. Return in 3 month or sooner if needed.

## 2016-05-26 ENCOUNTER — Ambulatory Visit
Admission: RE | Admit: 2016-05-26 | Discharge: 2016-05-26 | Disposition: A | Discharge status: Home / Self Care | Payer: Medicare Other | Source: Ambulatory Visit | Attending: Internal Medicine | Admitting: Internal Medicine

## 2016-05-26 DIAGNOSIS — Z1231 Encounter for screening mammogram for malignant neoplasm of breast: Secondary | ICD-10-CM

## 2016-06-02 DIAGNOSIS — M3320 Polymyositis, organ involvement unspecified: Secondary | ICD-10-CM | POA: Diagnosis not present

## 2016-06-02 DIAGNOSIS — M25511 Pain in right shoulder: Secondary | ICD-10-CM | POA: Diagnosis not present

## 2016-06-02 DIAGNOSIS — Z79899 Other long term (current) drug therapy: Secondary | ICD-10-CM | POA: Diagnosis not present

## 2016-08-11 DIAGNOSIS — H401231 Low-tension glaucoma, bilateral, mild stage: Secondary | ICD-10-CM | POA: Diagnosis not present

## 2016-08-24 ENCOUNTER — Ambulatory Visit: Payer: Medicare Other | Admitting: Podiatry

## 2016-08-28 DIAGNOSIS — I10 Essential (primary) hypertension: Secondary | ICD-10-CM | POA: Diagnosis not present

## 2016-08-28 DIAGNOSIS — R748 Abnormal levels of other serum enzymes: Secondary | ICD-10-CM | POA: Diagnosis not present

## 2016-08-28 DIAGNOSIS — I480 Paroxysmal atrial fibrillation: Secondary | ICD-10-CM | POA: Diagnosis not present

## 2016-08-28 DIAGNOSIS — E559 Vitamin D deficiency, unspecified: Secondary | ICD-10-CM | POA: Diagnosis not present

## 2016-08-31 ENCOUNTER — Encounter: Payer: Self-pay | Admitting: Podiatry

## 2016-08-31 ENCOUNTER — Ambulatory Visit (INDEPENDENT_AMBULATORY_CARE_PROVIDER_SITE_OTHER): Payer: Medicare Other | Admitting: Podiatry

## 2016-08-31 DIAGNOSIS — M79671 Pain in right foot: Secondary | ICD-10-CM | POA: Diagnosis not present

## 2016-08-31 DIAGNOSIS — M79672 Pain in left foot: Secondary | ICD-10-CM | POA: Diagnosis not present

## 2016-08-31 DIAGNOSIS — B351 Tinea unguium: Secondary | ICD-10-CM

## 2016-08-31 NOTE — Progress Notes (Signed)
Subjective: 76 y.o. year old female patient presents complaining of painful toe nails.  Objective: Dermatologic:Thick yellow deformed nails x 10.  Vascular:Pedal pulses are all palpable. Orthopedic:Contracted lesser digits  Neurologic:All epicritic and tactile sensations grossly intact.  Assessment: Dystrophic mycotic nails x 10, with pain.  Treatment: All mycotic nails debrided.  Return in 3 month or sooner if needed.

## 2016-08-31 NOTE — Patient Instructions (Signed)
Seen for hypertrophic nails. All nails debrided. Return in 3 months or as needed.  

## 2016-09-01 DIAGNOSIS — I480 Paroxysmal atrial fibrillation: Secondary | ICD-10-CM | POA: Diagnosis not present

## 2016-09-01 DIAGNOSIS — M3320 Polymyositis, organ involvement unspecified: Secondary | ICD-10-CM | POA: Diagnosis not present

## 2016-09-01 DIAGNOSIS — I10 Essential (primary) hypertension: Secondary | ICD-10-CM | POA: Diagnosis not present

## 2016-09-01 DIAGNOSIS — Z0000 Encounter for general adult medical examination without abnormal findings: Secondary | ICD-10-CM | POA: Diagnosis not present

## 2016-09-08 DIAGNOSIS — Z79899 Other long term (current) drug therapy: Secondary | ICD-10-CM | POA: Diagnosis not present

## 2016-09-08 DIAGNOSIS — M3320 Polymyositis, organ involvement unspecified: Secondary | ICD-10-CM | POA: Diagnosis not present

## 2016-09-08 DIAGNOSIS — M25511 Pain in right shoulder: Secondary | ICD-10-CM | POA: Diagnosis not present

## 2016-11-20 DIAGNOSIS — R350 Frequency of micturition: Secondary | ICD-10-CM | POA: Diagnosis not present

## 2016-11-20 DIAGNOSIS — R631 Polydipsia: Secondary | ICD-10-CM | POA: Diagnosis not present

## 2016-11-20 DIAGNOSIS — R42 Dizziness and giddiness: Secondary | ICD-10-CM | POA: Diagnosis not present

## 2016-11-30 ENCOUNTER — Encounter: Payer: Self-pay | Admitting: Podiatry

## 2016-11-30 ENCOUNTER — Ambulatory Visit (INDEPENDENT_AMBULATORY_CARE_PROVIDER_SITE_OTHER): Payer: Medicare Other | Admitting: Podiatry

## 2016-11-30 DIAGNOSIS — M79671 Pain in right foot: Secondary | ICD-10-CM

## 2016-11-30 DIAGNOSIS — M79672 Pain in left foot: Secondary | ICD-10-CM

## 2016-11-30 DIAGNOSIS — B351 Tinea unguium: Secondary | ICD-10-CM

## 2016-11-30 DIAGNOSIS — M2041 Other hammer toe(s) (acquired), right foot: Secondary | ICD-10-CM

## 2016-11-30 NOTE — Patient Instructions (Signed)
Seen for hypertrophic nails and painful corns. All nails and digital corns debrided. Return in 3 months or as needed.

## 2016-11-30 NOTE — Progress Notes (Signed)
Subjective: 75y.o. year old female patient presents complaining of painful toe nails. 2nd toe right foot is very painful in closed in shoes.  Objective: Dermatologic:Thick yellow deformed nails x 10.  Painful digital corn 2nd right, 5th left. Vascular:Pedal pulses are all palpable. No edema or erythema noted. Orthopedic:Contracted lesser digits  Severe contracture 2nd right.  Neurologic:All epicritic and tactile sensations grossly intact.  Assessment: Dystrophic mycotic nails x 10, with pain. Hammer toe with digital corn painful 2nd right.  Treatment: All mycotic nails debrided.  Return in 3 month or sooner if needed.

## 2016-12-29 DIAGNOSIS — I10 Essential (primary) hypertension: Secondary | ICD-10-CM | POA: Diagnosis not present

## 2016-12-29 DIAGNOSIS — Z6837 Body mass index (BMI) 37.0-37.9, adult: Secondary | ICD-10-CM | POA: Diagnosis not present

## 2016-12-29 DIAGNOSIS — I482 Chronic atrial fibrillation: Secondary | ICD-10-CM | POA: Diagnosis not present

## 2017-01-19 DIAGNOSIS — I10 Essential (primary) hypertension: Secondary | ICD-10-CM | POA: Diagnosis not present

## 2017-02-02 DIAGNOSIS — I10 Essential (primary) hypertension: Secondary | ICD-10-CM | POA: Diagnosis not present

## 2017-02-08 DIAGNOSIS — H401232 Low-tension glaucoma, bilateral, moderate stage: Secondary | ICD-10-CM | POA: Diagnosis not present

## 2017-02-08 DIAGNOSIS — H2513 Age-related nuclear cataract, bilateral: Secondary | ICD-10-CM | POA: Diagnosis not present

## 2017-02-16 DIAGNOSIS — I10 Essential (primary) hypertension: Secondary | ICD-10-CM | POA: Diagnosis not present

## 2017-02-16 DIAGNOSIS — Z6837 Body mass index (BMI) 37.0-37.9, adult: Secondary | ICD-10-CM | POA: Diagnosis not present

## 2017-02-16 DIAGNOSIS — I482 Chronic atrial fibrillation: Secondary | ICD-10-CM | POA: Diagnosis not present

## 2017-02-23 DIAGNOSIS — I10 Essential (primary) hypertension: Secondary | ICD-10-CM | POA: Diagnosis not present

## 2017-02-23 DIAGNOSIS — M3320 Polymyositis, organ involvement unspecified: Secondary | ICD-10-CM | POA: Diagnosis not present

## 2017-03-02 DIAGNOSIS — E7800 Pure hypercholesterolemia, unspecified: Secondary | ICD-10-CM | POA: Diagnosis not present

## 2017-03-02 DIAGNOSIS — I10 Essential (primary) hypertension: Secondary | ICD-10-CM | POA: Diagnosis not present

## 2017-03-02 DIAGNOSIS — M3320 Polymyositis, organ involvement unspecified: Secondary | ICD-10-CM | POA: Diagnosis not present

## 2017-03-02 DIAGNOSIS — I480 Paroxysmal atrial fibrillation: Secondary | ICD-10-CM | POA: Diagnosis not present

## 2017-03-08 ENCOUNTER — Encounter: Payer: Self-pay | Admitting: Podiatry

## 2017-03-08 ENCOUNTER — Ambulatory Visit (INDEPENDENT_AMBULATORY_CARE_PROVIDER_SITE_OTHER): Payer: Medicare Other | Admitting: Podiatry

## 2017-03-08 DIAGNOSIS — M79672 Pain in left foot: Secondary | ICD-10-CM

## 2017-03-08 DIAGNOSIS — B351 Tinea unguium: Secondary | ICD-10-CM

## 2017-03-08 DIAGNOSIS — M79671 Pain in right foot: Secondary | ICD-10-CM

## 2017-03-08 DIAGNOSIS — M2041 Other hammer toe(s) (acquired), right foot: Secondary | ICD-10-CM

## 2017-03-08 NOTE — Patient Instructions (Signed)
Seen for hypertrophic nails. All nails debrided. Return in 3 months or as needed.  

## 2017-03-08 NOTE — Progress Notes (Signed)
Subjective: 75y.o. year old female patient presents complaining of painful toe nails and corns on 5th toes. Hurts to wear closed in shoes.  Objective: Dermatologic:Thick yellow deformed nails x 10.  Painful digital corn 5th bilateral. Vascular:Pedal pulses are all palpable. No edema or erythema noted. Orthopedic:Contracted lesser digits  Severe contracture 2nd right.  Neurologic:All epicritic and tactile sensations grossly intact.  Assessment: Dystrophic mycotic nails x 10, with pain. Hammer toe with digital corn painful 5th bilateral. Treatment: All mycotic nails debrided.  Return in 3 month or sooner if needed.

## 2017-03-09 DIAGNOSIS — Z79899 Other long term (current) drug therapy: Secondary | ICD-10-CM | POA: Diagnosis not present

## 2017-03-09 DIAGNOSIS — M25519 Pain in unspecified shoulder: Secondary | ICD-10-CM | POA: Diagnosis not present

## 2017-03-09 DIAGNOSIS — M6281 Muscle weakness (generalized): Secondary | ICD-10-CM | POA: Diagnosis not present

## 2017-03-09 DIAGNOSIS — M3320 Polymyositis, organ involvement unspecified: Secondary | ICD-10-CM | POA: Diagnosis not present

## 2017-03-20 DIAGNOSIS — M6281 Muscle weakness (generalized): Secondary | ICD-10-CM | POA: Diagnosis not present

## 2017-03-22 DIAGNOSIS — M6281 Muscle weakness (generalized): Secondary | ICD-10-CM | POA: Diagnosis not present

## 2017-03-27 DIAGNOSIS — M6281 Muscle weakness (generalized): Secondary | ICD-10-CM | POA: Diagnosis not present

## 2017-03-29 DIAGNOSIS — M6281 Muscle weakness (generalized): Secondary | ICD-10-CM | POA: Diagnosis not present

## 2017-03-30 DIAGNOSIS — H401232 Low-tension glaucoma, bilateral, moderate stage: Secondary | ICD-10-CM | POA: Diagnosis not present

## 2017-04-03 DIAGNOSIS — M6281 Muscle weakness (generalized): Secondary | ICD-10-CM | POA: Diagnosis not present

## 2017-04-05 DIAGNOSIS — M6281 Muscle weakness (generalized): Secondary | ICD-10-CM | POA: Diagnosis not present

## 2017-04-10 DIAGNOSIS — M6281 Muscle weakness (generalized): Secondary | ICD-10-CM | POA: Diagnosis not present

## 2017-04-17 DIAGNOSIS — M6281 Muscle weakness (generalized): Secondary | ICD-10-CM | POA: Diagnosis not present

## 2017-04-24 DIAGNOSIS — M6281 Muscle weakness (generalized): Secondary | ICD-10-CM | POA: Diagnosis not present

## 2017-04-26 DIAGNOSIS — M6281 Muscle weakness (generalized): Secondary | ICD-10-CM | POA: Diagnosis not present

## 2017-05-01 DIAGNOSIS — M6281 Muscle weakness (generalized): Secondary | ICD-10-CM | POA: Diagnosis not present

## 2017-05-03 DIAGNOSIS — M6281 Muscle weakness (generalized): Secondary | ICD-10-CM | POA: Diagnosis not present

## 2017-05-07 ENCOUNTER — Other Ambulatory Visit: Payer: Self-pay | Admitting: Internal Medicine

## 2017-05-07 DIAGNOSIS — Z1231 Encounter for screening mammogram for malignant neoplasm of breast: Secondary | ICD-10-CM

## 2017-05-25 DIAGNOSIS — I480 Paroxysmal atrial fibrillation: Secondary | ICD-10-CM | POA: Diagnosis not present

## 2017-05-25 DIAGNOSIS — M3320 Polymyositis, organ involvement unspecified: Secondary | ICD-10-CM | POA: Diagnosis not present

## 2017-05-25 DIAGNOSIS — E559 Vitamin D deficiency, unspecified: Secondary | ICD-10-CM | POA: Diagnosis not present

## 2017-05-25 DIAGNOSIS — E7800 Pure hypercholesterolemia, unspecified: Secondary | ICD-10-CM | POA: Diagnosis not present

## 2017-05-28 ENCOUNTER — Ambulatory Visit
Admission: RE | Admit: 2017-05-28 | Discharge: 2017-05-28 | Disposition: A | Discharge status: Home / Self Care | Payer: Medicare Other | Source: Ambulatory Visit | Attending: Internal Medicine | Admitting: Internal Medicine

## 2017-05-28 DIAGNOSIS — Z1231 Encounter for screening mammogram for malignant neoplasm of breast: Secondary | ICD-10-CM | POA: Diagnosis not present

## 2017-06-01 DIAGNOSIS — Z0000 Encounter for general adult medical examination without abnormal findings: Secondary | ICD-10-CM | POA: Diagnosis not present

## 2017-06-01 DIAGNOSIS — I10 Essential (primary) hypertension: Secondary | ICD-10-CM | POA: Diagnosis not present

## 2017-06-01 DIAGNOSIS — E7800 Pure hypercholesterolemia, unspecified: Secondary | ICD-10-CM | POA: Diagnosis not present

## 2017-06-01 DIAGNOSIS — I480 Paroxysmal atrial fibrillation: Secondary | ICD-10-CM | POA: Diagnosis not present

## 2017-06-07 ENCOUNTER — Ambulatory Visit: Payer: Medicare Other | Admitting: Podiatry

## 2017-06-08 DIAGNOSIS — M25519 Pain in unspecified shoulder: Secondary | ICD-10-CM | POA: Diagnosis not present

## 2017-06-08 DIAGNOSIS — M6281 Muscle weakness (generalized): Secondary | ICD-10-CM | POA: Diagnosis not present

## 2017-06-08 DIAGNOSIS — Z79899 Other long term (current) drug therapy: Secondary | ICD-10-CM | POA: Diagnosis not present

## 2017-06-08 DIAGNOSIS — M3320 Polymyositis, organ involvement unspecified: Secondary | ICD-10-CM | POA: Diagnosis not present

## 2017-06-14 ENCOUNTER — Ambulatory Visit (INDEPENDENT_AMBULATORY_CARE_PROVIDER_SITE_OTHER): Payer: Medicare Other | Admitting: Podiatry

## 2017-06-14 ENCOUNTER — Encounter: Payer: Self-pay | Admitting: Podiatry

## 2017-06-14 DIAGNOSIS — M79671 Pain in right foot: Secondary | ICD-10-CM

## 2017-06-14 DIAGNOSIS — M79672 Pain in left foot: Secondary | ICD-10-CM | POA: Diagnosis not present

## 2017-06-14 DIAGNOSIS — B351 Tinea unguium: Secondary | ICD-10-CM | POA: Diagnosis not present

## 2017-06-14 NOTE — Progress Notes (Signed)
Subjective: 76 y.o. year old female patient presents complaining of painful nails. Patient requests toe nails, corns and calluses trimmed.  Diabetic and under control. Last checked 2 weeks ago.   Objective: Dermatologic: Thick yellow deformed nails x 10. Digital corn 5th toe bilateral. Vascular: Pedal pulses are all palpable. Orthopedic: Contracted lesser digits bilateral with digital corn over 5th toe bilateral. Neurologic: All epicritic and tactile sensations grossly intact.  Assessment: Dystrophic mycotic nails x 10. Diabetic under control.  Treatment: All mycotic nails, corns, calluses debrided.  Return in 3 months or as needed.

## 2017-06-14 NOTE — Patient Instructions (Signed)
Seen for hypertrophic nails and corns. All nails and corns debrided. Return in 3 months or as needed.  

## 2017-08-24 DIAGNOSIS — I10 Essential (primary) hypertension: Secondary | ICD-10-CM | POA: Diagnosis not present

## 2017-08-24 DIAGNOSIS — Z6837 Body mass index (BMI) 37.0-37.9, adult: Secondary | ICD-10-CM | POA: Diagnosis not present

## 2017-08-24 DIAGNOSIS — N183 Chronic kidney disease, stage 3 (moderate): Secondary | ICD-10-CM | POA: Diagnosis not present

## 2017-08-24 DIAGNOSIS — I482 Chronic atrial fibrillation: Secondary | ICD-10-CM | POA: Diagnosis not present

## 2017-09-07 DIAGNOSIS — Z79899 Other long term (current) drug therapy: Secondary | ICD-10-CM | POA: Diagnosis not present

## 2017-09-07 DIAGNOSIS — M3320 Polymyositis, organ involvement unspecified: Secondary | ICD-10-CM | POA: Diagnosis not present

## 2017-09-07 DIAGNOSIS — M25519 Pain in unspecified shoulder: Secondary | ICD-10-CM | POA: Diagnosis not present

## 2017-09-07 DIAGNOSIS — M6281 Muscle weakness (generalized): Secondary | ICD-10-CM | POA: Diagnosis not present

## 2017-09-13 ENCOUNTER — Ambulatory Visit: Payer: Medicare Other | Admitting: Podiatry

## 2017-09-21 DIAGNOSIS — M3320 Polymyositis, organ involvement unspecified: Secondary | ICD-10-CM | POA: Diagnosis not present

## 2017-09-28 DIAGNOSIS — H401132 Primary open-angle glaucoma, bilateral, moderate stage: Secondary | ICD-10-CM | POA: Diagnosis not present

## 2017-09-28 DIAGNOSIS — H2513 Age-related nuclear cataract, bilateral: Secondary | ICD-10-CM | POA: Diagnosis not present

## 2017-10-29 IMAGING — MG 2D DIGITAL SCREENING BILATERAL MAMMOGRAM WITH CAD AND ADJUNCT TO
8 of 13 series · 8 of 29 positions shown · non-contrast
Comparison: Previous exam(s).

CLINICAL DATA: Screening.

EXAM:
2D DIGITAL SCREENING BILATERAL MAMMOGRAM WITH CAD AND ADJUNCT TOMO

[L XCCL]
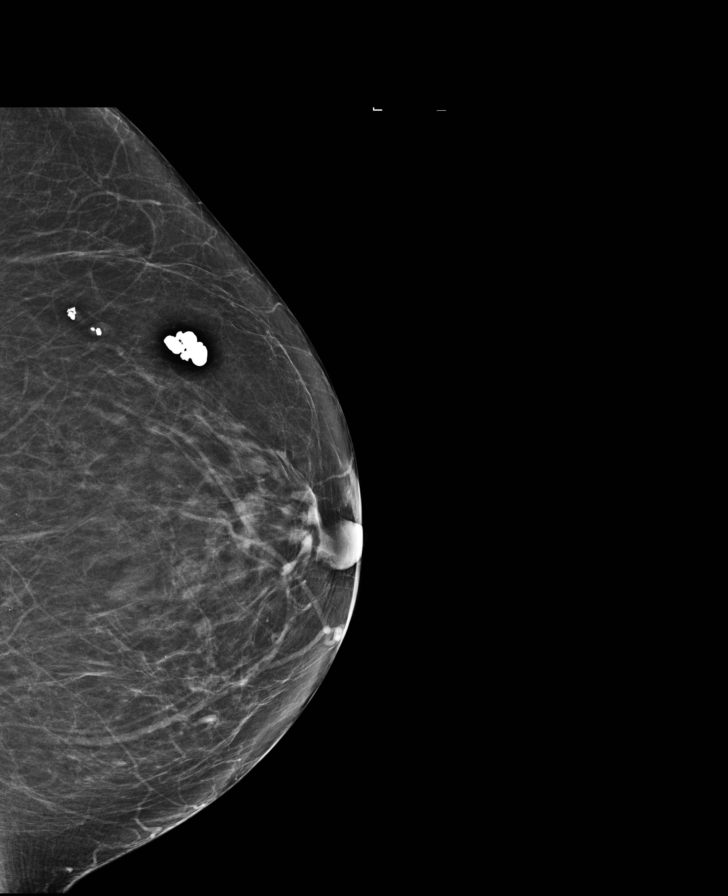

[L MLO]
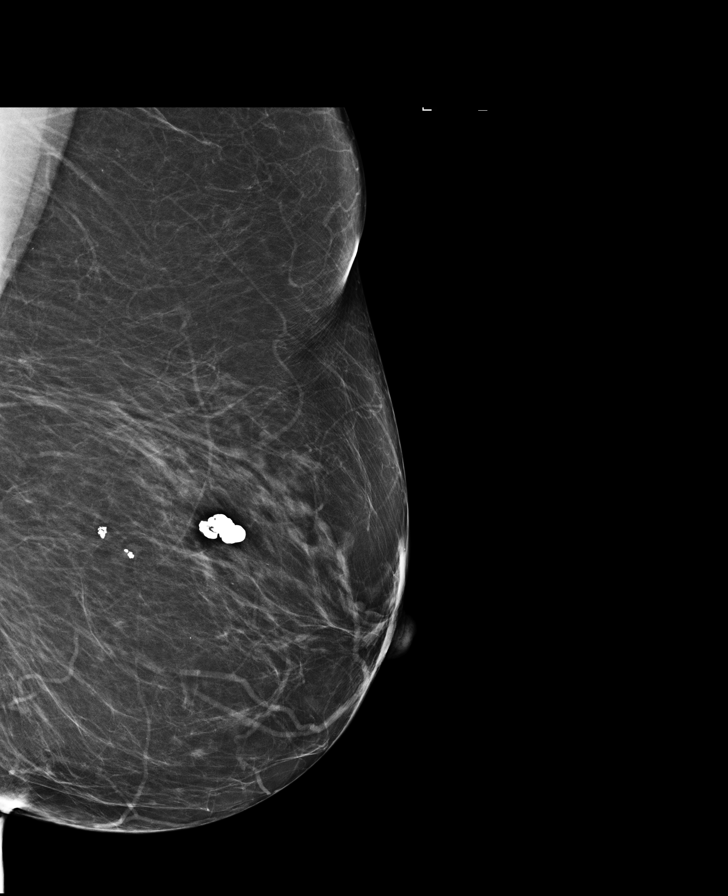

[L MLO synth-2D]
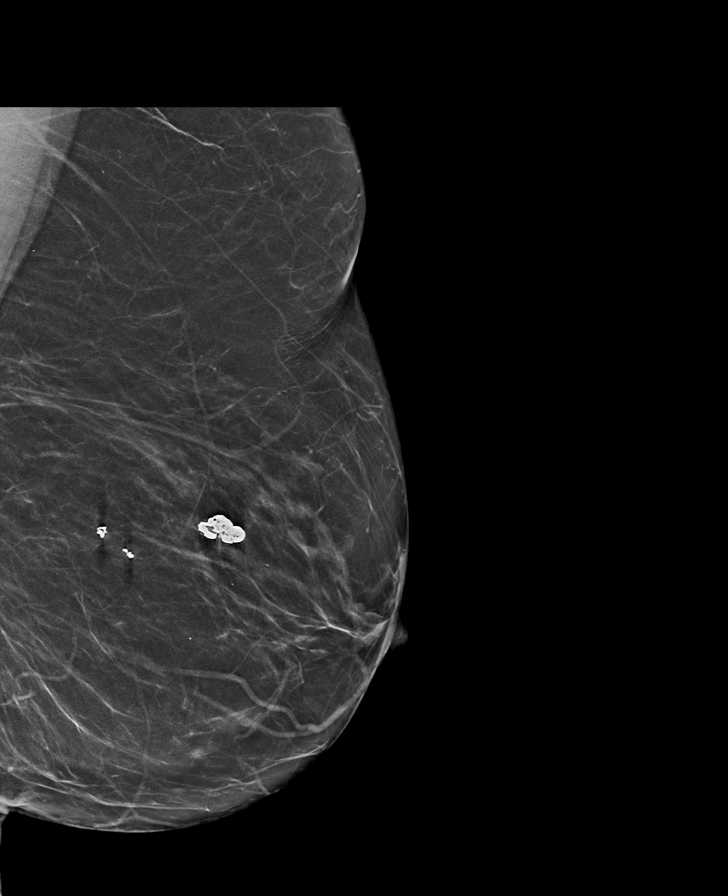

[R CC synth-2D]
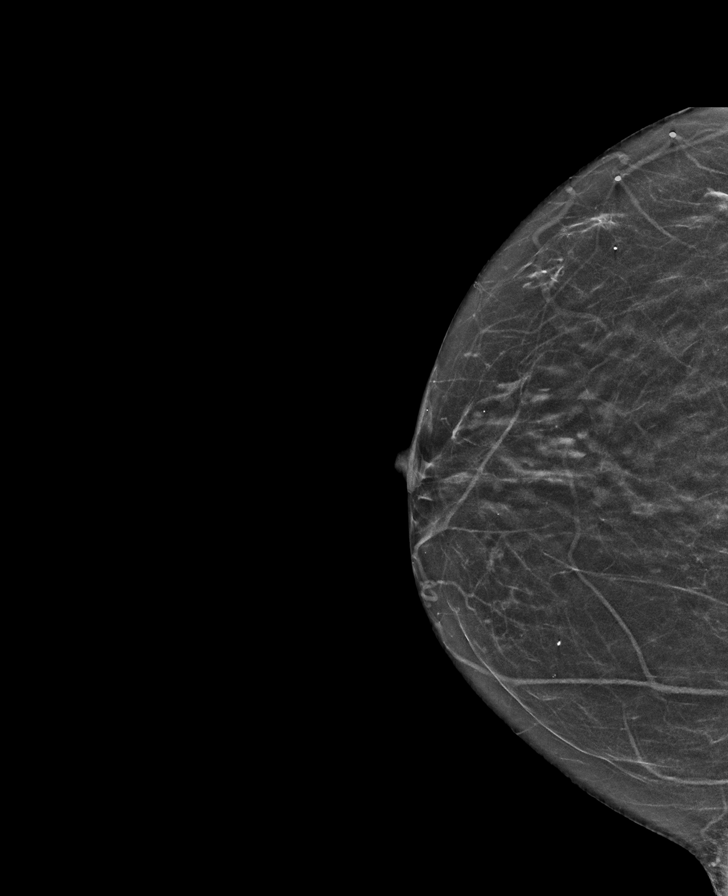

[L CC synth-2D]
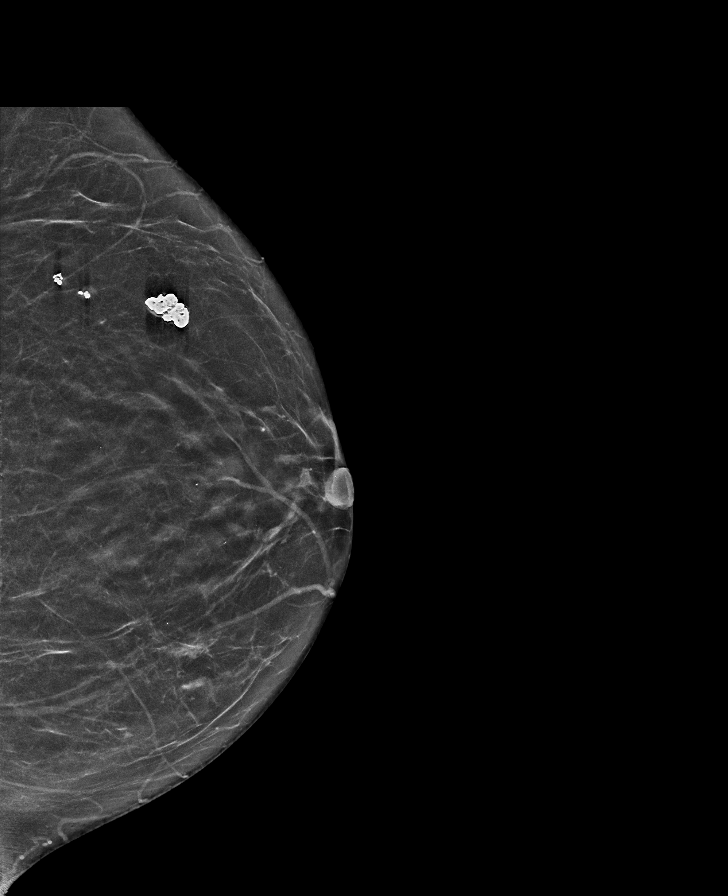

[R CC]
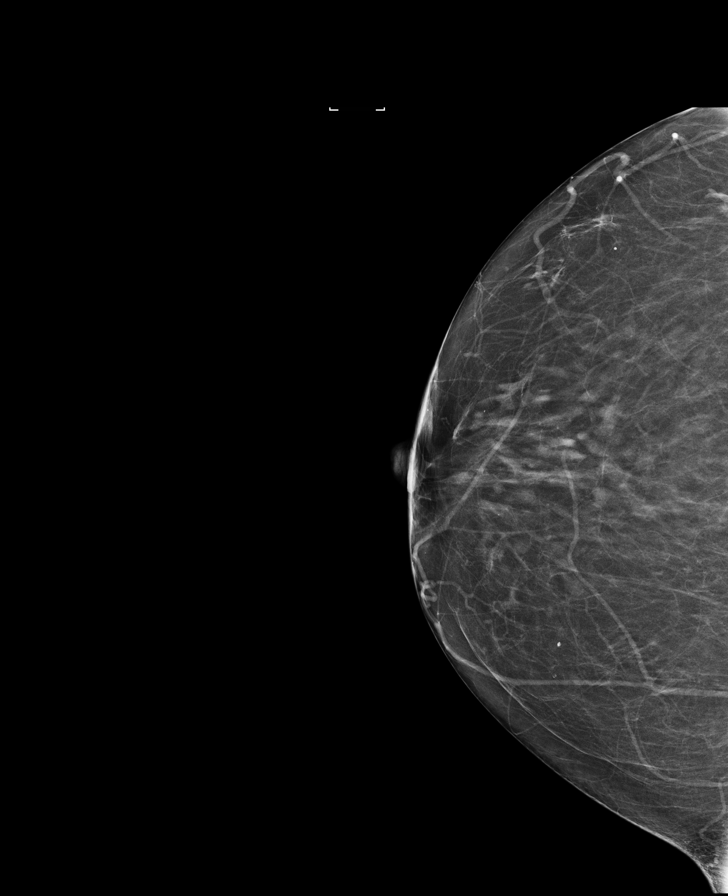

[R MLO synth-2D]
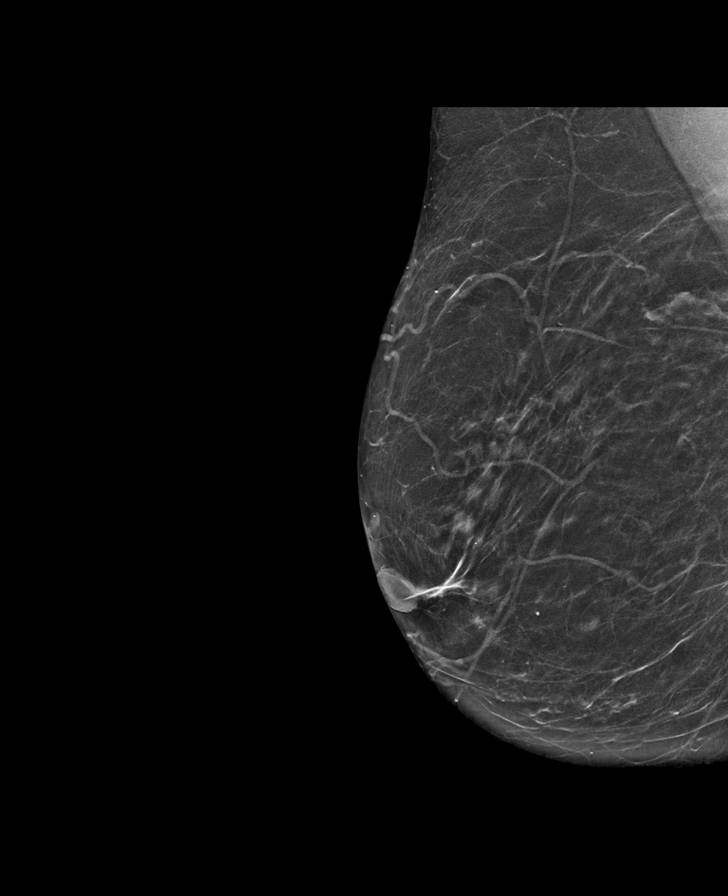

[L CC]
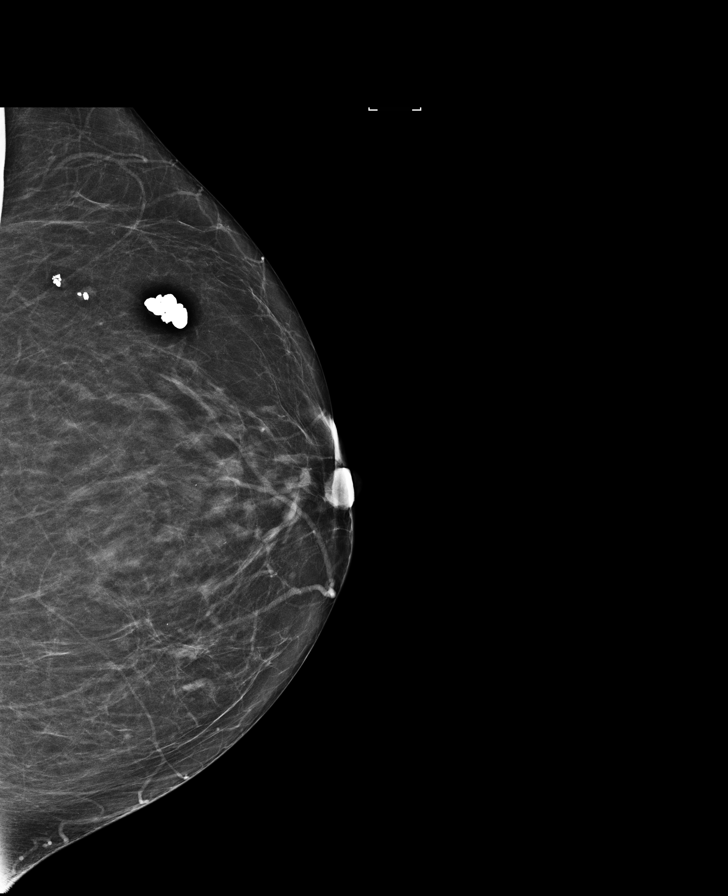

[8 of 29 positions shown; findings below may reference images not displayed]

ACR Breast Density Category b: There are scattered areas of
fibroglandular density.
FINDINGS: There are no findings suspicious for malignancy. Images were
processed with CAD.
IMPRESSION: No mammographic evidence of malignancy. A result letter of this
screening mammogram will be mailed directly to the patient.

RECOMMENDATION:
Screening mammogram in one year. (Code:97-6-RS4)

BI-RADS CATEGORY  1: Negative.

## 2017-11-01 ENCOUNTER — Encounter: Payer: Self-pay | Admitting: Podiatry

## 2017-11-01 ENCOUNTER — Ambulatory Visit: Payer: Medicare Other | Admitting: Podiatry

## 2017-11-01 DIAGNOSIS — M79671 Pain in right foot: Secondary | ICD-10-CM | POA: Diagnosis not present

## 2017-11-01 DIAGNOSIS — B351 Tinea unguium: Secondary | ICD-10-CM | POA: Diagnosis not present

## 2017-11-01 DIAGNOSIS — M79672 Pain in left foot: Secondary | ICD-10-CM

## 2017-11-01 NOTE — Progress Notes (Signed)
Subjective: 77 y.o. year old female patient presents complaining of painful nails. Patient requests toe nails trimmed.   Objective: Dermatologic: Thick yellow deformed nails x 10. Vascular: Pedal pulses are all palpable. Orthopedic: Contracted lesser digits bilateral. Neurologic: All epicritic and tactile sensations grossly intact.  Assessment: Dystrophic mycotic nails x 10. Painful feet.  Treatment: All mycotic nails debrided.  Return in 3 months or as needed.

## 2017-11-01 NOTE — Patient Instructions (Signed)
Seen for hypertrophic nails. All nails debrided. Return in 3 months or as needed.  

## 2017-11-22 DIAGNOSIS — I10 Essential (primary) hypertension: Secondary | ICD-10-CM | POA: Diagnosis not present

## 2017-11-22 DIAGNOSIS — Z0000 Encounter for general adult medical examination without abnormal findings: Secondary | ICD-10-CM | POA: Diagnosis not present

## 2017-11-22 DIAGNOSIS — R748 Abnormal levels of other serum enzymes: Secondary | ICD-10-CM | POA: Diagnosis not present

## 2017-11-22 DIAGNOSIS — I480 Paroxysmal atrial fibrillation: Secondary | ICD-10-CM | POA: Diagnosis not present

## 2017-12-07 DIAGNOSIS — E559 Vitamin D deficiency, unspecified: Secondary | ICD-10-CM | POA: Diagnosis not present

## 2017-12-07 DIAGNOSIS — E7800 Pure hypercholesterolemia, unspecified: Secondary | ICD-10-CM | POA: Diagnosis not present

## 2017-12-07 DIAGNOSIS — Z0000 Encounter for general adult medical examination without abnormal findings: Secondary | ICD-10-CM | POA: Diagnosis not present

## 2017-12-07 DIAGNOSIS — I10 Essential (primary) hypertension: Secondary | ICD-10-CM | POA: Diagnosis not present

## 2017-12-18 DIAGNOSIS — Z862 Personal history of diseases of the blood and blood-forming organs and certain disorders involving the immune mechanism: Secondary | ICD-10-CM | POA: Diagnosis not present

## 2017-12-21 DIAGNOSIS — M25519 Pain in unspecified shoulder: Secondary | ICD-10-CM | POA: Diagnosis not present

## 2017-12-21 DIAGNOSIS — M6281 Muscle weakness (generalized): Secondary | ICD-10-CM | POA: Diagnosis not present

## 2017-12-21 DIAGNOSIS — Z79899 Other long term (current) drug therapy: Secondary | ICD-10-CM | POA: Diagnosis not present

## 2017-12-21 DIAGNOSIS — Z13820 Encounter for screening for osteoporosis: Secondary | ICD-10-CM | POA: Diagnosis not present

## 2017-12-21 DIAGNOSIS — M3320 Polymyositis, organ involvement unspecified: Secondary | ICD-10-CM | POA: Diagnosis not present

## 2018-01-14 DIAGNOSIS — H0014 Chalazion left upper eyelid: Secondary | ICD-10-CM | POA: Diagnosis not present

## 2018-01-15 DIAGNOSIS — Z862 Personal history of diseases of the blood and blood-forming organs and certain disorders involving the immune mechanism: Secondary | ICD-10-CM | POA: Diagnosis not present

## 2018-01-28 DIAGNOSIS — M67431 Ganglion, right wrist: Secondary | ICD-10-CM | POA: Diagnosis not present

## 2018-02-05 ENCOUNTER — Ambulatory Visit: Payer: Medicare Other | Admitting: Podiatry

## 2018-02-19 ENCOUNTER — Ambulatory Visit: Payer: Medicare Other | Admitting: Podiatry

## 2018-02-19 DIAGNOSIS — M79671 Pain in right foot: Secondary | ICD-10-CM

## 2018-02-19 DIAGNOSIS — B351 Tinea unguium: Secondary | ICD-10-CM | POA: Diagnosis not present

## 2018-02-19 DIAGNOSIS — M79672 Pain in left foot: Secondary | ICD-10-CM | POA: Diagnosis not present

## 2018-02-19 NOTE — Patient Instructions (Signed)
Seen for hypertrophic nails. All nails debrided. Return in 3 months or as needed.  

## 2018-02-19 NOTE — Progress Notes (Signed)
Subjective: 77 y.o. year old female patient presents complaining of painful nails. Patient requests toe nails, corns and calluses trimmed.  Objective: Dermatologic: Thick yellow deformed nails x 10. Vascular: Pedal pulses are all palpable. Orthopedic: Contracted lesser digits bilateral. Neurologic: All epicritic and tactile sensations grossly intact.  Assessment: Dystrophic mycotic nails x 10.  Treatment: All mycotic nails debrided.  Return in 3 months or as needed.

## 2018-02-20 ENCOUNTER — Encounter: Payer: Self-pay | Admitting: Podiatry

## 2018-02-22 DIAGNOSIS — I10 Essential (primary) hypertension: Secondary | ICD-10-CM | POA: Diagnosis not present

## 2018-02-22 DIAGNOSIS — Z0189 Encounter for other specified special examinations: Secondary | ICD-10-CM | POA: Diagnosis not present

## 2018-02-22 DIAGNOSIS — I482 Chronic atrial fibrillation: Secondary | ICD-10-CM | POA: Diagnosis not present

## 2018-02-22 DIAGNOSIS — E668 Other obesity: Secondary | ICD-10-CM | POA: Diagnosis not present

## 2018-03-29 DIAGNOSIS — M25519 Pain in unspecified shoulder: Secondary | ICD-10-CM | POA: Diagnosis not present

## 2018-03-29 DIAGNOSIS — M6281 Muscle weakness (generalized): Secondary | ICD-10-CM | POA: Diagnosis not present

## 2018-03-29 DIAGNOSIS — H401232 Low-tension glaucoma, bilateral, moderate stage: Secondary | ICD-10-CM | POA: Diagnosis not present

## 2018-03-29 DIAGNOSIS — Z79899 Other long term (current) drug therapy: Secondary | ICD-10-CM | POA: Diagnosis not present

## 2018-03-29 DIAGNOSIS — M3320 Polymyositis, organ involvement unspecified: Secondary | ICD-10-CM | POA: Diagnosis not present

## 2018-04-05 DIAGNOSIS — M3320 Polymyositis, organ involvement unspecified: Secondary | ICD-10-CM | POA: Diagnosis not present

## 2018-04-05 DIAGNOSIS — Z79899 Other long term (current) drug therapy: Secondary | ICD-10-CM | POA: Diagnosis not present

## 2018-05-01 ENCOUNTER — Other Ambulatory Visit: Payer: Self-pay | Admitting: Internal Medicine

## 2018-05-01 DIAGNOSIS — Z1231 Encounter for screening mammogram for malignant neoplasm of breast: Secondary | ICD-10-CM

## 2018-05-22 ENCOUNTER — Ambulatory Visit: Payer: Medicare Other | Admitting: Podiatry

## 2018-05-28 ENCOUNTER — Ambulatory Visit: Payer: Medicare Other | Admitting: Podiatry

## 2018-05-31 ENCOUNTER — Ambulatory Visit
Admission: RE | Admit: 2018-05-31 | Discharge: 2018-05-31 | Disposition: A | Discharge status: Home / Self Care | Payer: Medicare Other | Source: Ambulatory Visit | Attending: Internal Medicine | Admitting: Internal Medicine

## 2018-05-31 DIAGNOSIS — Z1231 Encounter for screening mammogram for malignant neoplasm of breast: Secondary | ICD-10-CM

## 2018-05-31 DIAGNOSIS — E7800 Pure hypercholesterolemia, unspecified: Secondary | ICD-10-CM | POA: Diagnosis not present

## 2018-06-07 DIAGNOSIS — I10 Essential (primary) hypertension: Secondary | ICD-10-CM | POA: Diagnosis not present

## 2018-06-07 DIAGNOSIS — I4891 Unspecified atrial fibrillation: Secondary | ICD-10-CM | POA: Diagnosis not present

## 2018-06-07 DIAGNOSIS — E7800 Pure hypercholesterolemia, unspecified: Secondary | ICD-10-CM | POA: Diagnosis not present

## 2018-06-07 DIAGNOSIS — K219 Gastro-esophageal reflux disease without esophagitis: Secondary | ICD-10-CM | POA: Diagnosis not present

## 2018-06-24 DIAGNOSIS — Z862 Personal history of diseases of the blood and blood-forming organs and certain disorders involving the immune mechanism: Secondary | ICD-10-CM | POA: Diagnosis not present

## 2018-06-28 DIAGNOSIS — M25519 Pain in unspecified shoulder: Secondary | ICD-10-CM | POA: Diagnosis not present

## 2018-06-28 DIAGNOSIS — M6281 Muscle weakness (generalized): Secondary | ICD-10-CM | POA: Diagnosis not present

## 2018-06-28 DIAGNOSIS — M3320 Polymyositis, organ involvement unspecified: Secondary | ICD-10-CM | POA: Diagnosis not present

## 2018-06-28 DIAGNOSIS — Z79899 Other long term (current) drug therapy: Secondary | ICD-10-CM | POA: Diagnosis not present

## 2018-09-29 ENCOUNTER — Other Ambulatory Visit: Payer: Self-pay | Admitting: Cardiology

## 2018-10-01 DIAGNOSIS — Z862 Personal history of diseases of the blood and blood-forming organs and certain disorders involving the immune mechanism: Secondary | ICD-10-CM | POA: Diagnosis not present

## 2018-10-01 DIAGNOSIS — Z6836 Body mass index (BMI) 36.0-36.9, adult: Secondary | ICD-10-CM | POA: Diagnosis not present

## 2018-10-04 DIAGNOSIS — H401132 Primary open-angle glaucoma, bilateral, moderate stage: Secondary | ICD-10-CM | POA: Diagnosis not present

## 2018-10-04 DIAGNOSIS — H5213 Myopia, bilateral: Secondary | ICD-10-CM | POA: Diagnosis not present

## 2018-10-04 DIAGNOSIS — H2513 Age-related nuclear cataract, bilateral: Secondary | ICD-10-CM | POA: Diagnosis not present

## 2018-10-15 DIAGNOSIS — M6281 Muscle weakness (generalized): Secondary | ICD-10-CM | POA: Diagnosis not present

## 2018-10-15 DIAGNOSIS — Z79899 Other long term (current) drug therapy: Secondary | ICD-10-CM | POA: Diagnosis not present

## 2018-10-15 DIAGNOSIS — M3320 Polymyositis, organ involvement unspecified: Secondary | ICD-10-CM | POA: Diagnosis not present

## 2018-10-15 DIAGNOSIS — M25519 Pain in unspecified shoulder: Secondary | ICD-10-CM | POA: Diagnosis not present

## 2018-10-31 ENCOUNTER — Other Ambulatory Visit: Payer: Self-pay | Admitting: Cardiology

## 2018-11-28 DIAGNOSIS — M25519 Pain in unspecified shoulder: Secondary | ICD-10-CM | POA: Diagnosis not present

## 2018-11-28 DIAGNOSIS — Z79899 Other long term (current) drug therapy: Secondary | ICD-10-CM | POA: Diagnosis not present

## 2018-11-28 DIAGNOSIS — M6281 Muscle weakness (generalized): Secondary | ICD-10-CM | POA: Diagnosis not present

## 2018-11-28 DIAGNOSIS — M3320 Polymyositis, organ involvement unspecified: Secondary | ICD-10-CM | POA: Diagnosis not present

## 2018-12-04 ENCOUNTER — Other Ambulatory Visit: Payer: Self-pay | Admitting: Cardiology

## 2018-12-06 DIAGNOSIS — H2511 Age-related nuclear cataract, right eye: Secondary | ICD-10-CM | POA: Diagnosis not present

## 2018-12-06 DIAGNOSIS — H25811 Combined forms of age-related cataract, right eye: Secondary | ICD-10-CM | POA: Diagnosis not present

## 2018-12-06 HISTORY — PX: CATARACT EXTRACTION, BILATERAL: SHX1313

## 2018-12-19 DIAGNOSIS — H2512 Age-related nuclear cataract, left eye: Secondary | ICD-10-CM | POA: Diagnosis not present

## 2018-12-19 DIAGNOSIS — H25812 Combined forms of age-related cataract, left eye: Secondary | ICD-10-CM | POA: Diagnosis not present

## 2018-12-31 DIAGNOSIS — E7800 Pure hypercholesterolemia, unspecified: Secondary | ICD-10-CM | POA: Diagnosis not present

## 2018-12-31 DIAGNOSIS — E559 Vitamin D deficiency, unspecified: Secondary | ICD-10-CM | POA: Diagnosis not present

## 2018-12-31 DIAGNOSIS — I10 Essential (primary) hypertension: Secondary | ICD-10-CM | POA: Diagnosis not present

## 2018-12-31 DIAGNOSIS — M3320 Polymyositis, organ involvement unspecified: Secondary | ICD-10-CM | POA: Diagnosis not present

## 2019-01-03 DIAGNOSIS — Z0000 Encounter for general adult medical examination without abnormal findings: Secondary | ICD-10-CM | POA: Diagnosis not present

## 2019-01-03 DIAGNOSIS — E785 Hyperlipidemia, unspecified: Secondary | ICD-10-CM | POA: Diagnosis not present

## 2019-01-03 DIAGNOSIS — I10 Essential (primary) hypertension: Secondary | ICD-10-CM | POA: Diagnosis not present

## 2019-01-03 DIAGNOSIS — K219 Gastro-esophageal reflux disease without esophagitis: Secondary | ICD-10-CM | POA: Diagnosis not present

## 2019-01-16 DIAGNOSIS — Z79899 Other long term (current) drug therapy: Secondary | ICD-10-CM | POA: Diagnosis not present

## 2019-01-16 DIAGNOSIS — M3320 Polymyositis, organ involvement unspecified: Secondary | ICD-10-CM | POA: Diagnosis not present

## 2019-01-16 DIAGNOSIS — M25519 Pain in unspecified shoulder: Secondary | ICD-10-CM | POA: Diagnosis not present

## 2019-01-16 DIAGNOSIS — M6281 Muscle weakness (generalized): Secondary | ICD-10-CM | POA: Diagnosis not present

## 2019-01-29 DIAGNOSIS — Z961 Presence of intraocular lens: Secondary | ICD-10-CM | POA: Diagnosis not present

## 2019-01-30 ENCOUNTER — Other Ambulatory Visit: Payer: Self-pay

## 2019-01-30 MED ORDER — METOPROLOL SUCCINATE ER 100 MG PO TB24: 100.0000 mg | ORAL_TABLET | Freq: Two times a day (BID) | ORAL | 0 refills | Status: DC

## 2019-02-19 ENCOUNTER — Encounter: Payer: Self-pay | Admitting: Cardiology

## 2019-02-20 ENCOUNTER — Encounter: Payer: Self-pay | Admitting: Cardiology

## 2019-02-20 ENCOUNTER — Ambulatory Visit: Payer: Medicare Other | Admitting: Cardiology

## 2019-02-20 ENCOUNTER — Other Ambulatory Visit: Payer: Self-pay

## 2019-02-20 VITALS — BP 147/91 | HR 71 | Ht 65.0 in | Wt 220.0 lb

## 2019-02-20 DIAGNOSIS — I4821 Permanent atrial fibrillation: Secondary | ICD-10-CM | POA: Diagnosis not present

## 2019-02-20 DIAGNOSIS — I10 Essential (primary) hypertension: Secondary | ICD-10-CM

## 2019-02-20 DIAGNOSIS — E7800 Pure hypercholesterolemia, unspecified: Secondary | ICD-10-CM | POA: Diagnosis not present

## 2019-02-20 DIAGNOSIS — M3320 Polymyositis, organ involvement unspecified: Secondary | ICD-10-CM

## 2019-02-20 MED ORDER — VERAPAMIL HCL ER 240 MG PO TBCR: 240.0000 mg | EXTENDED_RELEASE_TABLET | Freq: Every day | ORAL | 2 refills | Status: DC

## 2019-02-20 NOTE — Progress Notes (Signed)
Virtual Visit via Telephone Note: Patient unable to use video assisted device.  This visit type was conducted due to national recommendations for restrictions regarding the COVID-19 Pandemic (e.g. social distancing).  This format is felt to be most appropriate for this patient at this time.  All issues noted in this document were discussed and addressed.  No physical exam was performed.  The patient has consented to conduct a Telehealth visit and understands insurance will be billed.   I connected with@, on 02/20/19 at  by TELEPHONE and verified that I am speaking with the correct person using two identifiers.   I discussed the limitations of evaluation and management by telemedicine and the availability of in person appointments. The patient expressed understanding and agreed to proceed.   I have discussed with patient regarding the safety during COVID Pandemic and steps and precautions to be taken including social distancing, frequent hand wash and use of detergent soap, gels with the patient. I asked the patient to avoid touching mouth, nose, eyes, ears with the hands. I encouraged regular walking around the neighborhood and exercise and regular diet, as long as social distancing can be maintained.  Primary Physician/Referring:  Jani Gravel, MD  [name], female    DOB: [birth year], 78 y.o.   MRN: [id number]  Subjective   Chief Complaint  Patient presents with  . Atrial Fibrillation  . Follow-up    80yr   HPI: Nancy Sosa is a 78y.o. female  atrial fibrillation, chronic dyspnea, hypertension presents here for 6 month follow-up. She denies any chest pain or palpitations, dizziness or syncope. Her past medical history is also significant for hyperlipidemia, polymyositis with chronically elevated CPK, chronic leg edema. She states that her blood pressure then well controlled and she has started to lose weight.   Had cataract surgery in May 2020. Has been on on  Prednisone for polymyalgia. Presently doing well.   Past Medical History:  Diagnosis Date  . Arthritis   . Asthma    also seasonal allergies/bronchitis  . Barrett's esophagus   . Bursitis of shoulder   . Cataracts, bilateral   . Dysrhythmia    chronic tachycardia.  nuclear stress test 03/31/11 for preop clearance for TKA --report on this chart from dr. GGwenlyn Perkingischemia, occas pvc,.  . GERD (gastroesophageal reflux disease)   . Hypercholesterolemia   . Hypertension    pt was on benicar hct and metoprolol--but after her knee replacement surg oct 2012--while in rehab--her b/p stayed so low that the benicar hct was d/c--she still takes the metoprolol  . S/P left knee arthroscopy   . S/P right knee arthroscopy   . Varicose veins     Past Surgical History:  Procedure Laterality Date  . ABDOMINAL HYSTERECTOMY    . CARDIOVERSION N/A 12/01/2014   Procedure: CARDIOVERSION;  Surgeon: JAdrian Prows MD;  [facility]  Service: Cardiovascular;  Laterality: N/A;  . CATARACT EXTRACTION, BILATERAL  12/06/2018  . CHOLECYSTECTOMY    . COLONOSCOPY    . fibroids breast removed    . fibroids hand removed    . FRACTURE SURGERY     rt wrist orif  . JOINT REPLACEMENT  06/05/11   right total knee arthroplasty  . KNEE ARTHROSCOPY     bilateral  . left foot bunionectomy    . MUSCLE BIOPSY Left 03/20/2014   Procedure: LEFT QUADRICEPS MUSCLE BIOPSY;  Surgeon: TOdis Hollingshead MD;  [facility]  Service: General;  Laterality: Left;  . PATELLAR TENDON REPAIR  06/26/2011   Procedure: PATELLA TENDON REPAIR;  Surgeon: Gearlean Alf;  Location: [facility];  Service: Orthopedics;  Laterality: Right;  Repair of Extensor Mechanisim  . PATELLAR TENDON REPAIR  07/28/2011   Procedure: PATELLA TENDON REPAIR;  Surgeon: Gearlean Alf;  Location: [facility];  Service: Orthopedics;  Laterality: Right;  . TONSILLECTOMY    . UPPER GI ENDOSCOPY      Social History   Socioeconomic History   . Marital status: Widowed    Spouse name: Not on file  . Number of children: 0  . Years of education: 12+  . Highest education level: Not on file  Occupational History  . Occupation: Retired  Scientific laboratory technician  . Financial resource strain: Not on file  . Food insecurity    Worry: Not on file    Inability: Not on file  . Transportation needs    Medical: Not on file    Non-medical: Not on file  Tobacco Use  . Smoking status: Never Smoker  . Smokeless tobacco: Never Used  Substance and Sexual Activity  . Alcohol use: No    Alcohol/week: 0.0 standard drinks  . Drug use: No  . Sexual activity: Not on file  Lifestyle  . Physical activity    Days per week: Not on file    Minutes per session: Not on file  . Stress: Not on file  Relationships  . Social Herbalist on phone: Not on file    Gets together: Not on file    Attends religious service: Not on file    Active member of club or organization: Not on file    Attends meetings of clubs or organizations: Not on file    Relationship status: Not on file  . Intimate partner violence    Fear of current or ex partner: Not on file    Emotionally abused: Not on file    Physically abused: Not on file    Forced sexual activity: Not on file  Other Topics Concern  . Not on file  Social History Narrative   Patient lives at home alone.    Patient is right handed.    Patient has 1 deceased child.    Patient is retired.    Review of Systems  Constitution: Positive for diaphoresis (excessive sweating due to medication). Negative for chills, decreased appetite, malaise/fatigue and weight gain.  Cardiovascular: Positive for dyspnea on exertion (stable). Negative for chest pain, claudication, leg swelling, palpitations and syncope.  Endocrine: Negative for cold intolerance.  Hematologic/Lymphatic: Does not bruise/bleed easily.  Skin:       Excessive sweating  Musculoskeletal: Positive for back pain, joint pain (chronic) and muscle  weakness (fibromyalgia and myositis). Negative for joint swelling.  Gastrointestinal: Negative for abdominal pain, anorexia, change in bowel habit, hematochezia and melena.  Neurological: Negative for headaches and light-headedness.  Psychiatric/Behavioral: Negative for depression and substance abuse.  All other systems reviewed and are negative.  Objective  Blood pressure (!) 147/91, pulse 71, height _0  (1.651 m), weight 220 lb (99.8 kg). Body mass index is 36.61 kg/m. Please see exam details from prior visit is as below.   Physical Exam  Constitutional: She appears well-developed. No distress.  Moderately obese  HENT:  Head: Atraumatic.  Eyes: Conjunctivae are normal.  Neck: Neck supple. No thyromegaly present.  Short neck and difficult to evaluate JVP  Cardiovascular: Normal rate and normal heart sounds.  An irregular rhythm present. Exam reveals no gallop.  No murmur heard. Pulses:      Carotid pulses are 2+ on the right side and 2+ on the left side.      Dorsalis pedis pulses are 1+ on the right side and 1+ on the left side.       Posterior tibial pulses are 1+ on the right side and 1+ on the left side.  S1 is variable, S2 is normal. Femoral and popliteal pulse difficult to feel due to patient's body habitus.   Pulmonary/Chest: Effort normal and breath sounds normal.  Abdominal: Soft. Bowel sounds are normal.  Obese. Pannus present  Musculoskeletal: Normal range of motion.        General: No edema.  Neurological: She is alert.  Skin: Skin is warm and dry.  Psychiatric: She has a normal mood and affect.   Radiology: No results found.  Laboratory examination:   Labs PCP office 11/22/2017: WBC low at 3.4, CBC otherwise normal.  Creatinine 0.84, EGFR 68, potassium 4.5, CMP normal.  CK level elevated at 448 previously 459 in February 2019.   Cholesterol 273, triglycerides 145, HDL 56, LDL 188.  TSH 3.5.  CMP Latest Ref Rng & Units 12/01/2014 06/30/2014 05/29/2014  Glucose  70 - 99 mg/dL 90 70 68  BUN 8 - 27 mg/dL - 19 17  Creatinine 0.57 - 1.00 mg/dL - 0.73 0.71  Sodium 135 - 145 mmol/L 143 146(H) 144  Potassium 3.5 - 5.1 mmol/L 3.1(L) 3.8 4.0  Chloride 97 - 108 mmol/L - 102 102  CO2 18 - 29 mmol/L - 25 24  Calcium 8.7 - 10.3 mg/dL - 10.2 10.1  Total Protein 6.0 - 8.5 g/dL - 6.3 -  Total Bilirubin 0.0 - 1.2 mg/dL - 0.5 -  Alkaline Phos 39 - 117 IU/L - 54 -  AST 0 - 40 IU/L - 37 -  ALT 0 - 32 IU/L - 48(H) -   CBC Latest Ref Rng & Units 12/01/2014 06/30/2014 03/17/2014  WBC 3.4 - 10.8 x10E3/uL - 14.3(H) 5.8  Hemoglobin 12.0 - 15.0 g/dL 15.6(H) 15.5 14.6  Hematocrit 36.0 - 46.0 % 46.0 45.9 43.1  Platelets 150 - 379 x10E3/uL - 217 225   Lipid Panel  No results found for: CHOL, TRIG, HDL, CHOLHDL, VLDL, LDLCALC, LDLDIRECT HEMOGLOBIN A1C Lab Results  Component Value Date   HGBA1C 5.7 (H) 05/29/2014   TSH No results for input(s): TSH in the last 8760 hours.  Medications   Current Outpatient Medications  Medication Instructions  . acetaminophen (TYLENOL) 650 mg, Every 4 hours PRN  . azaTHIOprine (IMURAN) 100 mg, Oral, Daily, 4 tabs in the mornig  . clindamycin (CLEOCIN) 150 MG capsule 1 capsule, Oral, As needed, dental  . ELIQUIS 5 MG TABS tablet TAKE 1 TABLET BY MOUTH TWICE DAILY  . latanoprost (XALATAN) 0.005 % ophthalmic solution INSTILL 1 DROP OU QHS  . loratadine (CLARITIN) 10 mg, Oral, Daily PRN  . pantoprazole (PROTONIX) 40 mg, Oral, Daily  . predniSONE (DELTASONE) 1 mg, 4 tabs daily  . spironolactone (ALDACTONE) 50 mg, Oral, Daily  . verapamil (CALAN-SR) 240 mg, Oral, Daily at bedtime   Cardiac Studies:   Echocardiogram 10/22/2014: Left ventricle cavity is normal in size. Moderate concentric hypertrophy of the left ventricle. Low normal decrease in global wall motion. Visual EF is 50%. Calculated EF 64%. Left atrial cavity is severely dilated and measures 5 cm.  Mild to moderate posteriorly directed mitral regurgitation. Mild mitral valve  leaflet thickening. I cannot exclude mild prolapse of the anterior leaflet. Mild tricuspid regurgitation. No evidence of pulmonary hypertension. Mild pulmonic regurgitation. Insignificant pericardial effusion.  Lexiscan myoview stress test 10/12/2014: 1. The resting electrocardiogram demonstrated atrial fibrillation and normal resting conduction.  Poor R progression, cannot exclude anterior infarct old.  Nonspecific T abnormality. Stress EKG is non-diagnostic for ischemia as it a pharmacologic stress using Lexiscan. Stress symptoms included dizziness. 2. Left ventricular cavity is noted to be normal on the rest and stress studies.  Additionally, the right ventricle is normal.  SPECT images demonstrate homogeneous tracer distribution throughout the myocardium.  The left ventricular ejection fraction was calculated or visually estimated to be 22% with global hypokinesis.  Clinical correlation is recommended.  Reduced ejection fraction may be an error due to difficulty in EKG gating due to atrial fibrillation. This is an intermediate risk  scan and clinical correlation is recommended.  Assessment     ICD-10-CM   1. Permanent atrial fibrillation  I48.21 verapamil (CALAN-SR) 240 MG CR tablet   CHA2DS2-VASc Score is 4 with yearly risk of stroke of 4.0%.  2. Essential hypertension  I10 verapamil (CALAN-SR) 240 MG CR tablet  3. Polymyositis ([place])  M33.20   4. Pure hypercholesterolemia  E78.00    EKG 02/22/2018: Atrial fibrillation with controlled 20) at the rate of 88 bpm, normal axis.  Poor progression, cannot exclude anteroseptal infarct old.  PVC.  Nonspecific T abnormality No significant change from EKG 08/24/2017.  Recommendations:   Patient is here on a 1 year follow-up of permanent atrial fibrillation.  I do not have a recent labs but she is willing to bring them on her next office visit with me which I set her up for a 72-monthvisit as I will discontinue metoprolol, patient has noticed unexplained  marked diaphoresis and I suspect it could be related to beta-blockers.  Also blood pressure is not well controlled hence I would like to try verapamil SR 240 mg daily.  Otherwise she has not had any palpitations, polymyositis and fibromyalgia continues to be a major issue and presently being managed by rheumatology.  She is back on steroids.  She does have hyperlipidemia but not on statins due to elevated CK enzymes.  She has no other cardiovascular risk factors.  She continues to be active and will be turning 78years of age in 2 months.  JAdrian Prows MD, FDeerpath Ambulatory Surgical Center LLC7/16/2020, 2:45 PM PGraysonCardiovascular. PGemPager: [phone] Office: [phone] no answer Cell [phone]

## 2019-02-26 ENCOUNTER — Other Ambulatory Visit: Payer: Self-pay | Admitting: Cardiology

## 2019-03-02 ENCOUNTER — Other Ambulatory Visit: Payer: Self-pay | Admitting: Cardiology

## 2019-03-20 DIAGNOSIS — M6281 Muscle weakness (generalized): Secondary | ICD-10-CM | POA: Diagnosis not present

## 2019-03-20 DIAGNOSIS — M25519 Pain in unspecified shoulder: Secondary | ICD-10-CM | POA: Diagnosis not present

## 2019-03-20 DIAGNOSIS — M3320 Polymyositis, organ involvement unspecified: Secondary | ICD-10-CM | POA: Diagnosis not present

## 2019-03-20 DIAGNOSIS — Z79899 Other long term (current) drug therapy: Secondary | ICD-10-CM | POA: Diagnosis not present

## 2019-04-23 ENCOUNTER — Other Ambulatory Visit: Payer: Self-pay | Admitting: Cardiology

## 2019-05-02 ENCOUNTER — Ambulatory Visit: Payer: Medicare Other | Admitting: Cardiology

## 2019-05-23 ENCOUNTER — Other Ambulatory Visit: Payer: Self-pay | Admitting: Cardiology

## 2019-05-23 DIAGNOSIS — I10 Essential (primary) hypertension: Secondary | ICD-10-CM

## 2019-05-23 DIAGNOSIS — I4821 Permanent atrial fibrillation: Secondary | ICD-10-CM

## 2019-05-27 ENCOUNTER — Ambulatory Visit: Payer: Medicare Other | Admitting: Cardiology

## 2019-06-02 DIAGNOSIS — H401132 Primary open-angle glaucoma, bilateral, moderate stage: Secondary | ICD-10-CM | POA: Diagnosis not present

## 2019-06-02 DIAGNOSIS — Z961 Presence of intraocular lens: Secondary | ICD-10-CM | POA: Diagnosis not present

## 2019-06-10 ENCOUNTER — Encounter: Payer: Self-pay | Admitting: Cardiology

## 2019-06-11 ENCOUNTER — Ambulatory Visit (INDEPENDENT_AMBULATORY_CARE_PROVIDER_SITE_OTHER): Payer: Medicare Other | Admitting: Cardiology

## 2019-06-11 ENCOUNTER — Encounter: Payer: Self-pay | Admitting: Cardiology

## 2019-06-11 ENCOUNTER — Other Ambulatory Visit: Payer: Self-pay

## 2019-06-11 VITALS — BP 137/100 | HR 80 | Temp 93.4°F | Ht 65.0 in | Wt 218.0 lb

## 2019-06-11 DIAGNOSIS — I4821 Permanent atrial fibrillation: Secondary | ICD-10-CM | POA: Diagnosis not present

## 2019-06-11 DIAGNOSIS — I10 Essential (primary) hypertension: Secondary | ICD-10-CM

## 2019-06-11 DIAGNOSIS — E7800 Pure hypercholesterolemia, unspecified: Secondary | ICD-10-CM

## 2019-06-11 MED ORDER — AMLODIPINE BESYLATE 10 MG PO TABS: 10.0000 mg | ORAL_TABLET | Freq: Every day | ORAL | 2 refills | Status: DC

## 2019-06-11 MED ORDER — METOPROLOL TARTRATE 100 MG PO TABS: 100.0000 mg | ORAL_TABLET | Freq: Two times a day (BID) | ORAL | 3 refills | Status: DC

## 2019-06-11 NOTE — Progress Notes (Signed)
Primary Physician/Referring:  Jani Gravel, MD  [name], female    DOB: 11/22/1940, 78 y.o.   MRN: [id number]  Subjective   Chief Complaint  Patient presents with  . Atrial Fibrillation  . Hypertension  . Follow-up   HPI: Nancy Sosa  is a 78 y.o. female  atrial fibrillation, chronic dyspnea, hypertension, hyperlipidemia, polymyositis with chronically elevated CPK, and chronic leg edema. Last seen 2 months ago virtually, due to new onset marked diaphoresis felt to be related to beta-blocker and also uncontrolled hypertension, metoprolol was changed to verapamil.  She now presents for follow-up. Unable to tolerate verapamil due to marked dizziness and now back on Metoprolol tartarate but at once a day dose.  She denies any chest pain or palpitations, dizziness or syncope.  Blood pressure has improved since being on verapamil.  She also continues to work to lose weight.  She is not on statin therapy for hyperlipidemia in view of CK elevation. Had cataract surgery in May 2020. Has been on on Prednisone and azathoprine for polymyalgia. Presently doing well.   Past Medical History:  Diagnosis Date  . Arthritis   . Asthma    also seasonal allergies/bronchitis  . Barrett's esophagus   . Bursitis of shoulder   . Cataracts, bilateral   . Dysrhythmia    chronic tachycardia.  nuclear stress test 03/31/11 for preop clearance for TKA --report on this chart from dr. Gwenlyn Perking ischemia, occas pvc,.  . GERD (gastroesophageal reflux disease)   . Hypercholesterolemia   . Hypertension    pt was on benicar hct and metoprolol--but after her knee replacement surg oct 2012--while in rehab--her b/p stayed so low that the benicar hct was d/c--she still takes the metoprolol  . S/P left knee arthroscopy   . S/P right knee arthroscopy   . Varicose veins     Past Surgical History:  Procedure Laterality Date  . ABDOMINAL HYSTERECTOMY    . CARDIOVERSION N/A 12/01/2014   Procedure:  CARDIOVERSION;  Surgeon: Adrian Prows, MD;  Location: [place];  Service: Cardiovascular;  Laterality: N/A;  . CATARACT EXTRACTION, BILATERAL  12/06/2018  . CHOLECYSTECTOMY    . COLONOSCOPY    . fibroids breast removed    . fibroids hand removed    . FRACTURE SURGERY     rt wrist orif  . JOINT REPLACEMENT  06/05/11   right total knee arthroplasty  . KNEE ARTHROSCOPY     bilateral  . left foot bunionectomy    . MUSCLE BIOPSY Left 03/20/2014   Procedure: LEFT QUADRICEPS MUSCLE BIOPSY;  Surgeon: Odis Hollingshead, MD;  Location: [place];  Service: General;  Laterality: Left;  . PATELLAR TENDON REPAIR  06/26/2011   Procedure: PATELLA TENDON REPAIR;  Surgeon: Gearlean Alf;  Location: [facility];  Service: Orthopedics;  Laterality: Right;  Repair of Extensor Mechanisim  . PATELLAR TENDON REPAIR  07/28/2011   Procedure: PATELLA TENDON REPAIR;  Surgeon: Gearlean Alf;  Location: [facility];  Service: Orthopedics;  Laterality: Right;  . TONSILLECTOMY    . UPPER GI ENDOSCOPY      Social History   Socioeconomic History  . Marital status: Widowed    Spouse name: Not on file  . Number of children: 0  . Years of education: 12+  . Highest education level: Not on file  Occupational History  . Occupation: Retired  Scientific laboratory technician  . Financial resource strain: Not on file  . Food insecurity  Worry: Not on file    Inability: Not on file  . Transportation needs    Medical: Not on file    Non-medical: Not on file  Tobacco Use  . Smoking status: Never Smoker  . Smokeless tobacco: Never Used  Substance and Sexual Activity  . Alcohol use: No    Alcohol/week: 0.0 standard drinks  . Drug use: No  . Sexual activity: Not on file  Lifestyle  . Physical activity    Days per week: Not on file    Minutes per session: Not on file  . Stress: Not on file  Relationships  . Social Herbalist on phone: Not on file    Gets together: Not on file    Attends religious service:  Not on file    Active member of club or organization: Not on file    Attends meetings of clubs or organizations: Not on file    Relationship status: Not on file  . Intimate partner violence    Fear of current or ex partner: Not on file    Emotionally abused: Not on file    Physically abused: Not on file    Forced sexual activity: Not on file  Other Topics Concern  . Not on file  Social History Narrative   Patient lives at home alone.    Patient is right handed.    Patient has 1 deceased child.    Patient is retired.    Review of Systems  Constitution: Positive for diaphoresis (excessive sweating due to medication). Negative for chills, decreased appetite, malaise/fatigue and weight gain.  Cardiovascular: Positive for dyspnea on exertion (stable). Negative for chest pain, claudication, leg swelling, palpitations and syncope.  Endocrine: Negative for cold intolerance.  Hematologic/Lymphatic: Does not bruise/bleed easily.  Skin:       Excessive sweating  Musculoskeletal: Positive for back pain, joint pain (chronic) and muscle weakness (fibromyalgia and myositis). Negative for joint swelling.  Gastrointestinal: Negative for abdominal pain, anorexia, change in bowel habit, hematochezia and melena.  Neurological: Negative for headaches and light-headedness.  Psychiatric/Behavioral: Negative for depression and substance abuse.  All other systems reviewed and are negative.  Objective  Blood pressure (!) 137/100, pulse 80, temperature (!) 93.4 F (34.1 C), height '5\' 5"'$  (1.651 m), weight 218 lb (98.9 kg), SpO2 98 %. Body mass index is 36.28 kg/m. Please see exam details from prior visit is as below.   Physical Exam  Constitutional: She appears well-developed. No distress.  Moderately obese  HENT:  Head: Atraumatic.  Eyes: Conjunctivae are normal.  Neck: Neck supple. No thyromegaly present.  Short neck and difficult to evaluate JVP  Cardiovascular: Normal heart sounds. An irregular  rhythm present. Tachycardia present. Exam reveals no gallop.  No murmur heard. Pulses:      Carotid pulses are 2+ on the right side and 2+ on the left side.      Dorsalis pedis pulses are 1+ on the right side and 1+ on the left side.       Posterior tibial pulses are 1+ on the right side and 1+ on the left side.  S1 is variable, S2 is normal. Femoral and popliteal pulse difficult to feel due to patient's body habitus.   Pulmonary/Chest: Effort normal and breath sounds normal.  Abdominal: Soft. Bowel sounds are normal.  Obese. Pannus present  Musculoskeletal: Normal range of motion.        General: No edema.  Neurological: She is alert.  Skin: Skin is  warm and dry.  Psychiatric: She has a normal mood and affect.   Radiology: No results found.  Laboratory examination:   Labs PCP office 11/22/2017: WBC low at 3.4, CBC otherwise normal.  Creatinine 0.84, EGFR 68, potassium 4.5, CMP normal.  CK level elevated at 448 previously 459 in February 2019.    Cholesterol 273, triglycerides 145, HDL 56, LDL 188.  TSH 3.5.  CMP Latest Ref Rng & Units 12/01/2014 06/30/2014 05/29/2014  Glucose 70 - 99 mg/dL 90 70 68  BUN 8 - 27 mg/dL - 19 17  Creatinine 0.57 - 1.00 mg/dL - 0.73 0.71  Sodium 135 - 145 mmol/L 143 146(H) 144  Potassium 3.5 - 5.1 mmol/L 3.1(L) 3.8 4.0  Chloride 97 - 108 mmol/L - 102 102  CO2 18 - 29 mmol/L - 25 24  Calcium 8.7 - 10.3 mg/dL - 10.2 10.1  Total Protein 6.0 - 8.5 g/dL - 6.3 -  Total Bilirubin 0.0 - 1.2 mg/dL - 0.5 -  Alkaline Phos 39 - 117 IU/L - 54 -  AST 0 - 40 IU/L - 37 -  ALT 0 - 32 IU/L - 48(H) -   CBC Latest Ref Rng & Units 12/01/2014 06/30/2014 03/17/2014  WBC 3.4 - 10.8 x10E3/uL - 14.3(H) 5.8  Hemoglobin 12.0 - 15.0 g/dL 15.6(H) 15.5 14.6  Hematocrit 36.0 - 46.0 % 46.0 45.9 43.1  Platelets 150 - 379 x10E3/uL - 217 225   Lipid Panel  No results found for: CHOL, TRIG, HDL, CHOLHDL, VLDL, LDLCALC, LDLDIRECT HEMOGLOBIN A1C Lab Results  Component Value Date    HGBA1C 5.7 (H) 05/29/2014   TSH No results for input(s): TSH in the last 8760 hours.  Medications   Current Outpatient Medications  Medication Instructions  . acetaminophen (TYLENOL) 650 mg, Every 4 hours PRN  . amLODipine (NORVASC) 10 mg, Oral, Daily  . azaTHIOprine (IMURAN) 100 mg, Oral, Daily, 4 tabs in the mornig  . clindamycin (CLEOCIN) 150 MG capsule 1 capsule, Oral, As needed, dental  . ELIQUIS 5 MG TABS tablet TAKE 1 TABLET BY MOUTH TWICE DAILY  . latanoprost (XALATAN) 0.005 % ophthalmic solution INSTILL 1 DROP OU QHS  . loratadine (CLARITIN) 10 mg, Oral, Daily PRN  . metoprolol tartrate (LOPRESSOR) 100 mg, Oral, 2 times daily  . pantoprazole (PROTONIX) 40 mg, Oral, Daily  . predniSONE (DELTASONE) 1 mg, 3 tabs daily  . spironolactone (ALDACTONE) 50 MG tablet TAKE 1 TABLET BY MOUTH DAILY   Cardiac Studies:   Echocardiogram 10/22/2014: Left ventricle cavity is normal in size. Moderate concentric hypertrophy of the left ventricle. Low normal decrease in global wall motion. Visual EF is 50%. Calculated EF 64%. Left atrial cavity is severely dilated and measures 5 cm.  Mild to moderate posteriorly directed mitral regurgitation. Mild mitral valve leaflet thickening. I cannot exclude mild prolapse of the anterior leaflet. Mild tricuspid regurgitation. No evidence of pulmonary hypertension. Mild pulmonic regurgitation. Insignificant pericardial effusion.  Lexiscan myoview stress test 10/12/2014: 1. The resting electrocardiogram demonstrated atrial fibrillation and normal resting conduction.  Poor R progression, cannot exclude anterior infarct old.  Nonspecific T abnormality. Stress EKG is non-diagnostic for ischemia as it a pharmacologic stress using Lexiscan. Stress symptoms included dizziness. 2. Left ventricular cavity is noted to be normal on the rest and stress studies.  Additionally, the right ventricle is normal.  SPECT images demonstrate homogeneous tracer distribution  throughout the myocardium.  The left ventricular ejection fraction was calculated or visually estimated to be 22% with global hypokinesis.  Clinical correlation is recommended.  Reduced ejection fraction may be an error due to difficulty in EKG gating due to atrial fibrillation. This is an intermediate risk  scan and clinical correlation is recommended.  Assessment     ICD-10-CM   1. Essential hypertension  I10 amLODipine (NORVASC) 10 MG tablet    metoprolol tartrate (LOPRESSOR) 100 MG tablet  2. Permanent atrial fibrillation (HCC)  I48.21 EKG 12-Lead    metoprolol tartrate (LOPRESSOR) 100 MG tablet   CHA2DS2-VASCScore: Risk Score  4,  Yearly risk of stroke 4.0%.  3. Pure hypercholesterolemia  E78.00    EKG 06/11/2019: Atrial fibrillation with rapid ventricular response at the rate of 111 bpm, left axis deviation, left Anterior fascicular block.  Anteroseptal infarct old.  Nonspecific T abnormality. Compared to EKG 02/22/2018, rate was better controlled.   Recommendations:   Nancy Sosa  Is an AAF with  atrial fibrillation, chronic dyspnea, hypertension, hyperlipidemia, polymyositis with chronically elevated CPK, and chronic leg edema. Patient is here on a 6 month follow-up of permanent atrial fibrillation and hypertension.  I do not have a recent labs  Polymyositis and fibromyalgia continues to be a major issue and presently being managed by rheumatology and in spite of steroids, she still states symptoms not well controlled  Her atrial fibrillation today's rapid ventricular response, she was been taking metoprolol Tartrate100 mg daily, we will change it to metoprolol tartrate 100 mg p.o. b.i.d. Also due to uncontrolled hypertension, I have added 10 mg of amlodipine daily.  On her last office visit and I had seen her on a virtual visit, I had added diltiazem which he did not tolerate states that she was extremely dizzy.  I'll like to see her back in 6 weeks for follow-up of hypertension  specifically and also for rate control.  Weight loss encouraged.   She does have hyperlipidemia but not on statins due to elevated CK enzymes.  She has no other cardiovascular risk factors.  She continues to be active.  Adrian Prows, MD, [facility] 06/11/2019, 2:29 PM [facility]. [place] Pager: [phone] Office: [phone] If no answer Cell [phone]

## 2019-06-13 ENCOUNTER — Other Ambulatory Visit: Payer: Self-pay

## 2019-06-13 DIAGNOSIS — I4821 Permanent atrial fibrillation: Secondary | ICD-10-CM

## 2019-06-13 MED ORDER — APIXABAN 5 MG PO TABS: 5.0000 mg | ORAL_TABLET | Freq: Two times a day (BID) | ORAL | 2 refills | Status: DC

## 2019-08-05 ENCOUNTER — Other Ambulatory Visit: Payer: Self-pay

## 2019-08-05 ENCOUNTER — Ambulatory Visit (INDEPENDENT_AMBULATORY_CARE_PROVIDER_SITE_OTHER): Payer: Medicare Other | Admitting: Cardiology

## 2019-08-05 ENCOUNTER — Encounter: Payer: Self-pay | Admitting: Cardiology

## 2019-08-05 VITALS — BP 128/71 | HR 82 | Ht 65.0 in | Wt 220.8 lb

## 2019-08-05 DIAGNOSIS — I4821 Permanent atrial fibrillation: Secondary | ICD-10-CM | POA: Diagnosis not present

## 2019-08-05 DIAGNOSIS — I10 Essential (primary) hypertension: Secondary | ICD-10-CM | POA: Diagnosis not present

## 2019-08-05 MED ORDER — AMLODIPINE BESYLATE 10 MG PO TABS: 10.0000 mg | ORAL_TABLET | Freq: Every day | ORAL | 3 refills | Status: DC

## 2019-08-05 NOTE — Progress Notes (Signed)
Primary Physician/Referring:  Jani Gravel, MD  [name], female    DOB: [birth year], 78 y.o.   MRN: [id number]  Subjective   Chief Complaint  Patient presents with  . Atrial Fibrillation  . Hypertension  . Follow-up    6wk   HPI: Nancy Sosa  is a 78 y.o. female  AAF with permanent atrial fibrillation, chronic dyspnea, hypertension, hyperlipidemia, Polymyositis and fibromyalgia  with chronically elevated CPK, and chronic leg edema. Patient is here on a 6-week follow-up of permanent atrial fibrillation and hypertension.   On her last office visit had increased metoprolol tartrate from 100 g daily to twice daily and also added amlodipine which she is tolerating due to uncontrolled hypertension.  She also had rapid ventricular response.  She now presents for follow-up.  States that she has noticed improvement in blood pressure and she is tolerating the medications well.  No new symptomatology.  Past Medical History:  Diagnosis Date  . Arthritis   . Asthma    also seasonal allergies/bronchitis  . Barrett's esophagus   . Bursitis of shoulder   . Cataracts, bilateral   . Dysrhythmia    chronic tachycardia.  nuclear stress test 03/31/11 for preop clearance for TKA --report on this chart from dr. Gwenlyn Perking ischemia, occas pvc,.  . GERD (gastroesophageal reflux disease)   . Hypercholesterolemia   . Hypertension    pt was on benicar hct and metoprolol--but after her knee replacement surg oct 2012--while in rehab--her b/p stayed so low that the benicar hct was d/c--she still takes the metoprolol  . S/P left knee arthroscopy   . S/P right knee arthroscopy   . Varicose veins     Past Surgical History:  Procedure Laterality Date  . ABDOMINAL HYSTERECTOMY    . CARDIOVERSION N/A 12/01/2014   Procedure: CARDIOVERSION;  Surgeon: Adrian Prows, MD;  Location: [place];  Service: Cardiovascular;  Laterality: N/A;  . CATARACT EXTRACTION, BILATERAL  12/06/2018  .  CHOLECYSTECTOMY    . COLONOSCOPY    . fibroids breast removed    . fibroids hand removed    . FRACTURE SURGERY     rt wrist orif  . JOINT REPLACEMENT  06/05/11   right total knee arthroplasty  . KNEE ARTHROSCOPY     bilateral  . left foot bunionectomy    . MUSCLE BIOPSY Left 03/20/2014   Procedure: LEFT QUADRICEPS MUSCLE BIOPSY;  Surgeon: Odis Hollingshead, MD;  Location: [place];  Service: General;  Laterality: Left;  . PATELLAR TENDON REPAIR  06/26/2011   Procedure: PATELLA TENDON REPAIR;  Surgeon: Gearlean Alf;  Location: [facility];  Service: Orthopedics;  Laterality: Right;  Repair of Extensor Mechanisim  . PATELLAR TENDON REPAIR  07/28/2011   Procedure: PATELLA TENDON REPAIR;  Surgeon: Gearlean Alf;  Location: [facility];  Service: Orthopedics;  Laterality: Right;  . TONSILLECTOMY    . UPPER GI ENDOSCOPY      Social History   Socioeconomic History  . Marital status: Widowed    Spouse name: Not on file  . Number of children: 0  . Years of education: 12+  . Highest education level: Not on file  Occupational History  . Occupation: Retired  Tobacco Use  . Smoking status: Never Smoker  . Smokeless tobacco: Never Used  Substance and Sexual Activity  . Alcohol use: No    Alcohol/week: 0.0 standard drinks  . Drug use: No  . Sexual activity: Not on file  Other Topics Concern  . Not on file  Social History Narrative   Patient lives at home alone.    Patient is right handed.    Patient has 1 deceased child.    Patient is retired.    Social Determinants of Health   Financial Resource Strain:   . Difficulty of Paying Living Expenses: Not on file  Food Insecurity:   . Worried About Charity fundraiser in the Last Year: Not on file  . Ran Out of Food in the Last Year: Not on file  Transportation Needs:   . Lack of Transportation (Medical): Not on file  . Lack of Transportation (Non-Medical): Not on file  Physical Activity:   . Days of Exercise per Week:  Not on file  . Minutes of Exercise per Session: Not on file  Stress:   . Feeling of Stress : Not on file  Social Connections:   . Frequency of Communication with Friends and Family: Not on file  . Frequency of Social Gatherings with Friends and Family: Not on file  . Attends Religious Services: Not on file  . Active Member of Clubs or Organizations: Not on file  . Attends Archivist Meetings: Not on file  . Marital Status: Not on file  Intimate Partner Violence:   . Fear of Current or Ex-Partner: Not on file  . Emotionally Abused: Not on file  . Physically Abused: Not on file  . Sexually Abused: Not on file   Review of Systems  Constitution: Positive for diaphoresis (excessive sweating due to medication). Negative for chills, decreased appetite, malaise/fatigue and weight gain.  Cardiovascular: Positive for dyspnea on exertion (stable). Negative for chest pain, claudication, leg swelling, palpitations and syncope.  Endocrine: Negative for cold intolerance.  Hematologic/Lymphatic: Does not bruise/bleed easily.  Skin:       Excessive sweating  Musculoskeletal: Positive for back pain, joint pain (chronic) and muscle weakness (fibromyalgia and myositis). Negative for joint swelling.  Gastrointestinal: Negative for abdominal pain, anorexia, change in bowel habit, hematochezia and melena.  Neurological: Negative for headaches and light-headedness.  Psychiatric/Behavioral: Negative for depression and substance abuse.  All other systems reviewed and are negative.  Objective  Blood pressure 128/71, pulse 82, height _0  (1.651 m), weight 220 lb 12.8 oz (100.2 kg), SpO2 96 %. Body mass index is 36.74 kg/m. Vitals with BMI 08/05/2019 06/11/2019 02/20/2019  Height _1  _2  _3   Weight 220 lbs 13 oz 218 lbs 220 lbs  BMI 36.74 46.27 03.50  Systolic 093 818 299  Diastolic 71 371 91  Pulse 82 80 71     Physical Exam  Constitutional: She appears well-developed. No distress.    Moderately obese  HENT:  Head: Atraumatic.  Eyes: Conjunctivae are normal.  Neck: No thyromegaly present.  Short neck and difficult to evaluate JVP  Cardiovascular: Normal heart sounds. An irregular rhythm present. Exam reveals no gallop.  No murmur heard. Pulses:      Carotid pulses are 2+ on the right side and 2+ on the left side.      Dorsalis pedis pulses are 1+ on the right side and 1+ on the left side.       Posterior tibial pulses are 1+ on the right side and 1+ on the left side.  S1 is variable, S2 is normal. Femoral and popliteal pulse difficult to feel due to patient's body habitus.   Pulmonary/Chest: Effort normal and breath sounds normal.  Abdominal: Soft. Bowel sounds  are normal.  Obese. Pannus present  Musculoskeletal:        General: No edema. Normal range of motion.     Cervical back: Neck supple.  Neurological: She is alert.  Skin: Skin is warm and dry.  Psychiatric: She has a normal mood and affect.   Radiology: No results found.  Laboratory examination:   Labs PCP office 11/22/2017: WBC low at 3.4, CBC otherwise normal.  Creatinine 0.84, EGFR 68, potassium 4.5, CMP normal.  CK level elevated at 448 previously 459 in February 2019.    Cholesterol 273, triglycerides 145, HDL 56, LDL 188.  TSH 3.5.  CMP Latest Ref Rng & Units 12/01/2014 06/30/2014 05/29/2014  Glucose 70 - 99 mg/dL 90 70 68  BUN 8 - 27 mg/dL - 19 17  Creatinine 0.57 - 1.00 mg/dL - 0.73 0.71  Sodium 135 - 145 mmol/L 143 146(H) 144  Potassium 3.5 - 5.1 mmol/L 3.1(L) 3.8 4.0  Chloride 97 - 108 mmol/L - 102 102  CO2 18 - 29 mmol/L - 25 24  Calcium 8.7 - 10.3 mg/dL - 10.2 10.1  Total Protein 6.0 - 8.5 g/dL - 6.3 -  Total Bilirubin 0.0 - 1.2 mg/dL - 0.5 -  Alkaline Phos 39 - 117 IU/L - 54 -  AST 0 - 40 IU/L - 37 -  ALT 0 - 32 IU/L - 48(H) -   CBC Latest Ref Rng & Units 12/01/2014 06/30/2014 03/17/2014  WBC 3.4 - 10.8 x10E3/uL - 14.3(H) 5.8  Hemoglobin 12.0 - 15.0 g/dL 15.6(H) 15.5 14.6   Hematocrit 36.0 - 46.0 % 46.0 45.9 43.1  Platelets 150 - 379 x10E3/uL - 217 225   Lipid Panel  No results found for: CHOL, TRIG, HDL, CHOLHDL, VLDL, LDLCALC, LDLDIRECT HEMOGLOBIN A1C Lab Results  Component Value Date   HGBA1C 5.7 (H) 05/29/2014   TSH No results for input(s): TSH in the last 8760 hours.  Medications   Current Outpatient Medications  Medication Instructions  . acetaminophen (TYLENOL) 650 mg, Every 4 hours PRN  . amLODipine (NORVASC) 10 mg, Oral, Daily  . apixaban (ELIQUIS) 5 mg, Oral, 2 times daily  . azaTHIOprine (IMURAN) 100 mg, Oral, Daily, 4 tabs in the mornig  . latanoprost (XALATAN) 0.005 % ophthalmic solution INSTILL 1 DROP OU QHS  . loratadine (CLARITIN) 10 mg, Oral, Daily PRN  . metoprolol tartrate (LOPRESSOR) 100 mg, Oral, 2 times daily  . pantoprazole (PROTONIX) 40 mg, Oral, Daily  . predniSONE (DELTASONE) 1 mg, 3 tabs daily  . spironolactone (ALDACTONE) 50 MG tablet TAKE 1 TABLET BY MOUTH DAILY   Cardiac Studies:   Echocardiogram 10/22/2014: Left ventricle cavity is normal in size. Moderate concentric hypertrophy of the left ventricle. Low normal decrease in global wall motion. Visual EF is 50%. Calculated EF 64%. Left atrial cavity is severely dilated and measures 5 cm.  Mild to moderate posteriorly directed mitral regurgitation. Mild mitral valve leaflet thickening. I cannot exclude mild prolapse of the anterior leaflet. Mild tricuspid regurgitation. No evidence of pulmonary hypertension. Mild pulmonic regurgitation. Insignificant pericardial effusion.  Lexiscan myoview stress test 10/12/2014: 1. The resting electrocardiogram demonstrated atrial fibrillation and normal resting conduction.  Poor R progression, cannot exclude anterior infarct old.  Nonspecific T abnormality. Stress EKG is non-diagnostic for ischemia as it a pharmacologic stress using Lexiscan. Stress symptoms included dizziness. 2. Left ventricular cavity is noted to be normal on the  rest and stress studies.  Additionally, the right ventricle is normal.  SPECT images demonstrate homogeneous tracer  distribution throughout the myocardium.  The left ventricular ejection fraction was calculated or visually estimated to be 22% with global hypokinesis.  Clinical correlation is recommended.  Reduced ejection fraction may be an error due to difficulty in EKG gating due to atrial fibrillation. This is an intermediate risk  scan and clinical correlation is recommended.  Assessment     ICD-10-CM   1. Permanent atrial fibrillation (HCC)  I48.21 EKG 12-Lead  2. Essential hypertension  I10 amLODipine (NORVASC) 10 MG tablet  3. Permanent atrial fibrillation (HCC)  I48.21 EKG 12-Lead   CHA2DS2-VASCScore: Risk Score  4,  Yearly risk of stroke 4.0%.   EKG 08/05/2019: Atrial fibrillation with controlled ventricular response at the rate of 97 bpm, left axis deviation, poor R-wave progression, cannot exclude anteroseptal infarct old.  Borderline criteria for LVH.  Nonspecific T abnormality. Compared to EKG 06/11/2019: Atrial fibrillation with rapid ventricular response at the rate of 111 bpm.   Recommendations:   Nancy Sosa  Is an AAF with permanent atrial fibrillation, chronic dyspnea, hypertension, hyperlipidemia, Polymyositis and fibromyalgia  with chronically elevated CPK, and chronic leg edema. Patient is here on a 6-week follow-up of permanent atrial fibrillation and hypertension.  On her last office visit had increased metoprolol tartrate from 100 g daily to twice daily and also added amlodipine which she is tolerating due to uncontrolled hypertension.  She also had rapid ventricular response.  She now presents for follow-up.   Heart rate is much improved, blood pressure is also well controlled.  Continue present medications, I will see her back in one year.  Advised her to continue to remain active and also to watch out for calorie intake.  Weight loss will certainly help with heart  rate and also blood pressure control.  Adrian Prows, MD, [facility] 08/05/2019, 2:27 PM [facility]. [place] Pager: [phone] Office: [phone] If no answer Cell [phone]

## 2019-08-06 ENCOUNTER — Other Ambulatory Visit: Payer: Self-pay | Admitting: Internal Medicine

## 2019-08-06 DIAGNOSIS — M3320 Polymyositis, organ involvement unspecified: Secondary | ICD-10-CM | POA: Diagnosis not present

## 2019-08-06 DIAGNOSIS — Z79899 Other long term (current) drug therapy: Secondary | ICD-10-CM | POA: Diagnosis not present

## 2019-08-06 DIAGNOSIS — M25519 Pain in unspecified shoulder: Secondary | ICD-10-CM | POA: Diagnosis not present

## 2019-08-06 DIAGNOSIS — M6281 Muscle weakness (generalized): Secondary | ICD-10-CM | POA: Diagnosis not present

## 2019-08-06 DIAGNOSIS — Z1231 Encounter for screening mammogram for malignant neoplasm of breast: Secondary | ICD-10-CM

## 2019-08-07 ENCOUNTER — Ambulatory Visit: Payer: Medicare Other | Admitting: Cardiology

## 2019-08-27 ENCOUNTER — Other Ambulatory Visit: Payer: Self-pay | Admitting: Cardiology

## 2019-09-17 ENCOUNTER — Inpatient Hospital Stay: Admission: RE | Admit: 2019-09-17 | Payer: Medicare Other | Source: Ambulatory Visit

## 2019-10-24 ENCOUNTER — Ambulatory Visit: Payer: Medicare Other

## 2019-11-20 DIAGNOSIS — Z79899 Other long term (current) drug therapy: Secondary | ICD-10-CM | POA: Diagnosis not present

## 2019-11-20 DIAGNOSIS — M25519 Pain in unspecified shoulder: Secondary | ICD-10-CM | POA: Diagnosis not present

## 2019-11-20 DIAGNOSIS — M3320 Polymyositis, organ involvement unspecified: Secondary | ICD-10-CM | POA: Diagnosis not present

## 2019-11-20 DIAGNOSIS — R05 Cough: Secondary | ICD-10-CM | POA: Diagnosis not present

## 2019-11-20 DIAGNOSIS — M6281 Muscle weakness (generalized): Secondary | ICD-10-CM | POA: Diagnosis not present

## 2019-11-21 ENCOUNTER — Ambulatory Visit
Admission: RE | Admit: 2019-11-21 | Discharge: 2019-11-21 | Disposition: A | Discharge status: Home / Self Care | Payer: Medicare Other | Source: Ambulatory Visit | Attending: Internal Medicine | Admitting: Internal Medicine

## 2019-11-21 ENCOUNTER — Other Ambulatory Visit: Payer: Self-pay

## 2019-11-21 DIAGNOSIS — Z1231 Encounter for screening mammogram for malignant neoplasm of breast: Secondary | ICD-10-CM | POA: Diagnosis not present

## 2019-12-02 DIAGNOSIS — H401132 Primary open-angle glaucoma, bilateral, moderate stage: Secondary | ICD-10-CM | POA: Diagnosis not present

## 2019-12-02 DIAGNOSIS — Z961 Presence of intraocular lens: Secondary | ICD-10-CM | POA: Diagnosis not present

## 2020-01-06 DIAGNOSIS — E559 Vitamin D deficiency, unspecified: Secondary | ICD-10-CM | POA: Diagnosis not present

## 2020-01-06 DIAGNOSIS — Z1321 Encounter for screening for nutritional disorder: Secondary | ICD-10-CM | POA: Diagnosis not present

## 2020-01-29 DIAGNOSIS — E559 Vitamin D deficiency, unspecified: Secondary | ICD-10-CM | POA: Diagnosis not present

## 2020-01-29 DIAGNOSIS — R269 Unspecified abnormalities of gait and mobility: Secondary | ICD-10-CM | POA: Diagnosis not present

## 2020-01-29 DIAGNOSIS — M3320 Polymyositis, organ involvement unspecified: Secondary | ICD-10-CM | POA: Diagnosis not present

## 2020-01-29 DIAGNOSIS — Z0000 Encounter for general adult medical examination without abnormal findings: Secondary | ICD-10-CM | POA: Diagnosis not present

## 2020-02-05 DIAGNOSIS — M79604 Pain in right leg: Secondary | ICD-10-CM | POA: Diagnosis not present

## 2020-02-05 DIAGNOSIS — M25512 Pain in left shoulder: Secondary | ICD-10-CM | POA: Diagnosis not present

## 2020-02-05 DIAGNOSIS — I4891 Unspecified atrial fibrillation: Secondary | ICD-10-CM | POA: Diagnosis not present

## 2020-02-05 DIAGNOSIS — M542 Cervicalgia: Secondary | ICD-10-CM | POA: Diagnosis not present

## 2020-02-05 DIAGNOSIS — I051 Rheumatic mitral insufficiency: Secondary | ICD-10-CM | POA: Diagnosis not present

## 2020-02-05 DIAGNOSIS — M3320 Polymyositis, organ involvement unspecified: Secondary | ICD-10-CM | POA: Diagnosis not present

## 2020-02-05 DIAGNOSIS — I10 Essential (primary) hypertension: Secondary | ICD-10-CM | POA: Diagnosis not present

## 2020-02-05 DIAGNOSIS — E785 Hyperlipidemia, unspecified: Secondary | ICD-10-CM | POA: Diagnosis not present

## 2020-02-05 DIAGNOSIS — E669 Obesity, unspecified: Secondary | ICD-10-CM | POA: Diagnosis not present

## 2020-02-05 DIAGNOSIS — J45909 Unspecified asthma, uncomplicated: Secondary | ICD-10-CM | POA: Diagnosis not present

## 2020-02-05 DIAGNOSIS — M4800 Spinal stenosis, site unspecified: Secondary | ICD-10-CM | POA: Diagnosis not present

## 2020-02-05 DIAGNOSIS — E559 Vitamin D deficiency, unspecified: Secondary | ICD-10-CM | POA: Diagnosis not present

## 2020-02-05 DIAGNOSIS — M25511 Pain in right shoulder: Secondary | ICD-10-CM | POA: Diagnosis not present

## 2020-02-05 DIAGNOSIS — K219 Gastro-esophageal reflux disease without esophagitis: Secondary | ICD-10-CM | POA: Diagnosis not present

## 2020-02-05 DIAGNOSIS — M1712 Unilateral primary osteoarthritis, left knee: Secondary | ICD-10-CM | POA: Diagnosis not present

## 2020-02-05 DIAGNOSIS — G8929 Other chronic pain: Secondary | ICD-10-CM | POA: Diagnosis not present

## 2020-02-19 DIAGNOSIS — E785 Hyperlipidemia, unspecified: Secondary | ICD-10-CM | POA: Diagnosis not present

## 2020-02-19 DIAGNOSIS — I480 Paroxysmal atrial fibrillation: Secondary | ICD-10-CM | POA: Diagnosis not present

## 2020-02-19 DIAGNOSIS — I10 Essential (primary) hypertension: Secondary | ICD-10-CM | POA: Diagnosis not present

## 2020-02-19 DIAGNOSIS — M3320 Polymyositis, organ involvement unspecified: Secondary | ICD-10-CM | POA: Diagnosis not present

## 2020-02-20 ENCOUNTER — Other Ambulatory Visit: Payer: Self-pay | Admitting: Cardiology

## 2020-02-23 ENCOUNTER — Other Ambulatory Visit: Payer: Self-pay | Admitting: Cardiology

## 2020-02-23 DIAGNOSIS — I4821 Permanent atrial fibrillation: Secondary | ICD-10-CM

## 2020-03-06 DIAGNOSIS — M3320 Polymyositis, organ involvement unspecified: Secondary | ICD-10-CM | POA: Diagnosis not present

## 2020-03-06 DIAGNOSIS — I10 Essential (primary) hypertension: Secondary | ICD-10-CM | POA: Diagnosis not present

## 2020-03-06 DIAGNOSIS — E785 Hyperlipidemia, unspecified: Secondary | ICD-10-CM | POA: Diagnosis not present

## 2020-03-06 DIAGNOSIS — G8929 Other chronic pain: Secondary | ICD-10-CM | POA: Diagnosis not present

## 2020-03-06 DIAGNOSIS — E559 Vitamin D deficiency, unspecified: Secondary | ICD-10-CM | POA: Diagnosis not present

## 2020-03-06 DIAGNOSIS — M542 Cervicalgia: Secondary | ICD-10-CM | POA: Diagnosis not present

## 2020-03-06 DIAGNOSIS — K219 Gastro-esophageal reflux disease without esophagitis: Secondary | ICD-10-CM | POA: Diagnosis not present

## 2020-03-06 DIAGNOSIS — M25512 Pain in left shoulder: Secondary | ICD-10-CM | POA: Diagnosis not present

## 2020-03-06 DIAGNOSIS — M1712 Unilateral primary osteoarthritis, left knee: Secondary | ICD-10-CM | POA: Diagnosis not present

## 2020-03-06 DIAGNOSIS — M25511 Pain in right shoulder: Secondary | ICD-10-CM | POA: Diagnosis not present

## 2020-03-06 DIAGNOSIS — I051 Rheumatic mitral insufficiency: Secondary | ICD-10-CM | POA: Diagnosis not present

## 2020-03-06 DIAGNOSIS — M79604 Pain in right leg: Secondary | ICD-10-CM | POA: Diagnosis not present

## 2020-03-06 DIAGNOSIS — M4800 Spinal stenosis, site unspecified: Secondary | ICD-10-CM | POA: Diagnosis not present

## 2020-03-06 DIAGNOSIS — I4891 Unspecified atrial fibrillation: Secondary | ICD-10-CM | POA: Diagnosis not present

## 2020-03-06 DIAGNOSIS — E669 Obesity, unspecified: Secondary | ICD-10-CM | POA: Diagnosis not present

## 2020-03-06 DIAGNOSIS — J45909 Unspecified asthma, uncomplicated: Secondary | ICD-10-CM | POA: Diagnosis not present

## 2020-03-23 DIAGNOSIS — Z79899 Other long term (current) drug therapy: Secondary | ICD-10-CM | POA: Diagnosis not present

## 2020-03-23 DIAGNOSIS — M25519 Pain in unspecified shoulder: Secondary | ICD-10-CM | POA: Diagnosis not present

## 2020-03-23 DIAGNOSIS — M6281 Muscle weakness (generalized): Secondary | ICD-10-CM | POA: Diagnosis not present

## 2020-03-23 DIAGNOSIS — M3320 Polymyositis, organ involvement unspecified: Secondary | ICD-10-CM | POA: Diagnosis not present

## 2020-06-02 DIAGNOSIS — Z961 Presence of intraocular lens: Secondary | ICD-10-CM | POA: Diagnosis not present

## 2020-06-02 DIAGNOSIS — H401132 Primary open-angle glaucoma, bilateral, moderate stage: Secondary | ICD-10-CM | POA: Diagnosis not present

## 2020-06-03 ENCOUNTER — Other Ambulatory Visit: Payer: Self-pay | Admitting: Cardiology

## 2020-06-03 DIAGNOSIS — I10 Essential (primary) hypertension: Secondary | ICD-10-CM

## 2020-06-03 DIAGNOSIS — I4821 Permanent atrial fibrillation: Secondary | ICD-10-CM

## 2020-06-24 DIAGNOSIS — M25519 Pain in unspecified shoulder: Secondary | ICD-10-CM | POA: Diagnosis not present

## 2020-06-24 DIAGNOSIS — M3320 Polymyositis, organ involvement unspecified: Secondary | ICD-10-CM | POA: Diagnosis not present

## 2020-06-24 DIAGNOSIS — M6281 Muscle weakness (generalized): Secondary | ICD-10-CM | POA: Diagnosis not present

## 2020-06-24 DIAGNOSIS — Z79899 Other long term (current) drug therapy: Secondary | ICD-10-CM | POA: Diagnosis not present

## 2020-06-28 ENCOUNTER — Ambulatory Visit: Payer: Medicare Other | Admitting: Cardiology

## 2020-06-28 ENCOUNTER — Other Ambulatory Visit: Payer: Self-pay

## 2020-06-28 ENCOUNTER — Encounter: Payer: Self-pay | Admitting: Cardiology

## 2020-06-28 VITALS — BP 123/86 | HR 70 | Resp 16 | Ht 65.0 in | Wt 211.0 lb

## 2020-06-28 DIAGNOSIS — E7800 Pure hypercholesterolemia, unspecified: Secondary | ICD-10-CM | POA: Diagnosis not present

## 2020-06-28 DIAGNOSIS — I10 Essential (primary) hypertension: Secondary | ICD-10-CM

## 2020-06-28 DIAGNOSIS — I4821 Permanent atrial fibrillation: Secondary | ICD-10-CM | POA: Diagnosis not present

## 2020-06-28 NOTE — Progress Notes (Signed)
Primary Physician/Referring:  Janie Morning, DO  [name], female    DOB: [birth year], 79 y.o.   MRN: [id number]  Subjective   Chief Complaint  Patient presents with  . Follow-up    1 year  . Atrial Fibrillation  . Hypertension   HPI: Nancy Sosa  is a 79 y.o. female  AAF with permanent atrial fibrillation, chronic dyspnea, hypertension, hyperlipidemia, Polymyositis and fibromyalgia with chronically elevated CPK, and chronic leg edema. Patient is here on a 1 year follow-up of permanent atrial fibrillation and hypertension.   She is presently doing well and except for her arthritis and fibromyalgia that bothers her, she is presently doing well.  No bleeding diathesis on Eliquis.  She is tolerating all her medications well.  She uses a walker to help walking at home.  Past Medical History:  Diagnosis Date  . Arthritis   . Asthma    also seasonal allergies/bronchitis  . Barrett's esophagus   . Bursitis of shoulder   . Cataracts, bilateral   . Dysrhythmia    chronic tachycardia.  nuclear stress test 03/31/11 for preop clearance for TKA --report on this chart from dr. Gwenlyn Perking ischemia, occas pvc,.  . GERD (gastroesophageal reflux disease)   . Hypercholesterolemia   . Hypertension    pt was on benicar hct and metoprolol--but after her knee replacement surg oct 2012--while in rehab--her b/p stayed so low that the benicar hct was d/c--she still takes the metoprolol  . S/P left knee arthroscopy   . S/P right knee arthroscopy   . Varicose veins     Past Surgical History:  Procedure Laterality Date  . ABDOMINAL HYSTERECTOMY    . CARDIOVERSION N/A 12/01/2014   Procedure: CARDIOVERSION;  Surgeon: Adrian Prows, MD;  Location: [facility];  Service: Cardiovascular;  Laterality: N/A;  . CATARACT EXTRACTION, BILATERAL  12/06/2018  . CHOLECYSTECTOMY    . COLONOSCOPY    . fibroids breast removed    . fibroids hand removed    . FRACTURE SURGERY     rt wrist orif    . JOINT REPLACEMENT  06/05/11   right total knee arthroplasty  . KNEE ARTHROSCOPY     bilateral  . left foot bunionectomy    . MUSCLE BIOPSY Left 03/20/2014   Procedure: LEFT QUADRICEPS MUSCLE BIOPSY;  Surgeon: Odis Hollingshead, MD;  Location: [facility];  Service: General;  Laterality: Left;  . PATELLAR TENDON REPAIR  06/26/2011   Procedure: PATELLA TENDON REPAIR;  Surgeon: Gearlean Alf;  Location: [facility];  Service: Orthopedics;  Laterality: Right;  Repair of Extensor Mechanisim  . PATELLAR TENDON REPAIR  07/28/2011   Procedure: PATELLA TENDON REPAIR;  Surgeon: Gearlean Alf;  Location: [facility];  Service: Orthopedics;  Laterality: Right;  . TONSILLECTOMY    . UPPER GI ENDOSCOPY     Social History   Tobacco Use  . Smoking status: Never Smoker  . Smokeless tobacco: Never Used  Substance Use Topics  . Alcohol use: No    Alcohol/week: 0.0 standard drinks   Marital Status: Widowed   Review of Systems  Cardiovascular: Positive for dyspnea on exertion (mild and stable) and leg swelling (occasional). Negative for chest pain.  Musculoskeletal: Positive for arthritis, back pain, joint pain and myalgias.  Gastrointestinal: Negative for melena.  Genitourinary: Positive for frequency.   Objective  Blood pressure 123/86, pulse 70, resp. rate 16, height 5' 5"  (1.651 m), weight 211 lb (95.7 kg), SpO2 96 %.  Body mass index is 35.11 kg/m. Vitals with BMI 06/28/2020 08/05/2019 06/11/2019  Height 5' 5"  5' 5"  5' 5"   Weight 211 lbs 220 lbs 13 oz 218 lbs  BMI 35.11 97.02 63.78  Systolic 588 502 774  Diastolic 86 71 128  Pulse 70 82 80     Physical Exam Constitutional:      General: She is not in acute distress.    Appearance: She is well-developed. She is obese.     Comments: Moderately obese  HENT:     Head: Atraumatic.  Eyes:     Conjunctiva/sclera: Conjunctivae normal.  Neck:     Thyroid: No thyromegaly.     Comments: Short neck and difficult to evaluate  JVP Cardiovascular:     Rate and Rhythm: Rhythm irregular.     Pulses:          Carotid pulses are 2+ on the right side and 2+ on the left side.      Dorsalis pedis pulses are 1+ on the right side and 1+ on the left side.       Posterior tibial pulses are 1+ on the right side and 1+ on the left side.     Heart sounds: Normal heart sounds. No murmur heard.  No gallop.      Comments: S1 is variable, S2 is normal. Femoral and popliteal pulse difficult to feel due to patient's body habitus.  Pulmonary:     Effort: Pulmonary effort is normal.     Breath sounds: Normal breath sounds.  Abdominal:     General: Bowel sounds are normal.     Palpations: Abdomen is soft.     Comments: Obese. Pannus present  Musculoskeletal:        General: Normal range of motion.     Cervical back: Neck supple.  Skin:    General: Skin is warm and dry.  Neurological:     Mental Status: She is alert.    Radiology: No results found.  Laboratory examination:   External labs:  Cholesterol, total 239.000 m 01/06/2020 HDL 64.000 mg 01/06/2020 LDL-C 159.000 m 01/06/2020 Triglycerides 93.000 mg 01/06/2020  Labs 06/24/2020:  Hb 15.1/HCT 45.8, platelets 247, normal indicis.  Serum glucose 86 mg, BUN 23, creatinine 0.75, EGFR >60 mL.  Potassium 4.9.  Sodium mildly elevated at 146.  CMP normal otherwise.  CPK 354 stable.  Vitamin D 25.1.  Creatinine, Serum 0.670 MG/ 03/23/2020 ALT (SGPT) 30.000 IU/ 03/23/2020   Labs PCP office 11/22/2017: WBC low at 3.4, CBC otherwise normal.  Creatinine 0.84, EGFR 68, potassium 4.5, CMP normal.  CK level elevated at 448 previously 459 in February 2019.    Cholesterol 273, triglycerides 145, HDL 56, LDL 188.  TSH 3.5.  Medications   Current Outpatient Medications on File Prior to Visit  Medication Sig Dispense Refill  . acetaminophen (TYLENOL) 325 MG tablet Take 650 mg by mouth every 4 (four) hours as needed. Pain     . amLODipine (NORVASC) 10 MG tablet Take 1 tablet (10 mg  total) by mouth daily. 90 tablet 3  . azaTHIOprine (IMURAN) 50 MG tablet Take 100 mg by mouth daily. 4 tabs in the mornig    . ELIQUIS 5 MG TABS tablet TAKE 1 TABLET(5 MG) BY MOUTH TWICE DAILY 180 tablet 6  . ergocalciferol (VITAMIN D2) 1.25 MG (50000 UT) capsule Take 1 capsule by mouth once a week.    . ezetimibe (ZETIA) 10 MG tablet Take 10 mg by mouth daily.    Marland Kitchen  ezetimibe (ZETIA) 10 MG tablet Take by mouth.    . latanoprost (XALATAN) 0.005 % ophthalmic solution INSTILL 1 DROP OU QHS    . loratadine (CLARITIN) 10 MG tablet Take 10 mg by mouth daily as needed for allergies.     . metoprolol tartrate (LOPRESSOR) 100 MG tablet TAKE 1 TABLET(100 MG) BY MOUTH TWICE DAILY 180 tablet 3  . pantoprazole (PROTONIX) 40 MG tablet Take 40 mg by mouth daily.     . predniSONE (DELTASONE) 5 MG tablet 1 mg. 3 tabs daily    . spironolactone (ALDACTONE) 50 MG tablet TAKE 1 TABLET BY MOUTH DAILY 90 tablet 1  . Vitamin D, Ergocalciferol, (DRISDOL) 1.25 MG (50000 UNIT) CAPS capsule Take 50,000 Units by mouth once a week.     No current facility-administered medications on file prior to visit.    Cardiac Studies:   Echocardiogram 10/22/2014: Left ventricle cavity is normal in size. Moderate concentric hypertrophy of the left ventricle. Low normal decrease in global wall motion. Visual EF is 50%. Calculated EF 64%. Left atrial cavity is severely dilated and measures 5 cm.  Mild to moderate posteriorly directed mitral regurgitation. Mild mitral valve leaflet thickening. I cannot exclude mild prolapse of the anterior leaflet. Mild tricuspid regurgitation. No evidence of pulmonary hypertension. Mild pulmonic regurgitation. Insignificant pericardial effusion.  Lexiscan myoview stress test 10/12/2014: 1. The resting electrocardiogram demonstrated atrial fibrillation and normal resting conduction.  Poor R progression, cannot exclude anterior infarct old.  Nonspecific T abnormality. Stress EKG is non-diagnostic for  ischemia as it a pharmacologic stress using Lexiscan. Stress symptoms included dizziness. 2. Left ventricular cavity is noted to be normal on the rest and stress studies.  Additionally, the right ventricle is normal.  SPECT images demonstrate homogeneous tracer distribution throughout the myocardium.  The left ventricular ejection fraction was calculated or visually estimated to be 22% with global hypokinesis.  Clinical correlation is recommended.  Reduced ejection fraction may be an error due to difficulty in EKG gating due to atrial fibrillation. This is an intermediate risk  scan and clinical correlation is recommended.  EKG:  EKG 06/28/2020: Atrial fibrillation with controlled ventricular response at the rate of 71 bpm, left axis deviation, poor R wave progression, cannot exclude anteroseptal infarct old.  Pulmonary disease pattern with low-voltage complexes.  Nonspecific T abnormality.  No significant change from EKG 08/05/2019.  Assessment     ICD-10-CM   1. Permanent atrial fibrillation (HCC)  I48.21 EKG 12-Lead  2. Essential hypertension  I10   3. Pure hypercholesterolemia  E78.00   4. Hypercholesteremia  E78.00    No orders of the defined types were placed in this encounter. There are no discontinued medications.   Recommendations:   Nancy Sosa  Is a 79 y.o. AAF with permanent atrial fibrillation, chronic dyspnea, hypertension, hyperlipidemia, Polymyositis and fibromyalgia  with chronically elevated CPK, and chronic leg edema. Patient is here on a 6-week follow-up of permanent atrial fibrillation and hypertension.  She is presently doing well and heart rate is well controlled and blood pressure is also well controlled on present medical regimen.  No clinical evidence to support acute diastolic heart failure.  She has no leg edema, no JVD and heart rate is also well controlled.  I reviewed her labs, her LDL is markedly elevated however we are unable to use any statin due to her  chronically elevated CK enzymes and fibromyalgia.  As she does not have any known vascular disease, not sure that we should try  PCSK9 inhibitors.  She is presently 79 years of age and is doing well.  I did not make any changes to her medications, I will see her back in a year.  Labs have been stable.  No bleeding diathesis on Eliquis.   Adrian Prows, MD, [facility] 06/28/2020, 3:58 PM Office: [phone] Pager: [phone]

## 2020-06-29 NOTE — Progress Notes (Signed)
Labs 03/23/2020:  Hb 15.1/HCT 45.8, platelets 247, normal indicis.  Labs 06/24/2020:  Serum glucose 86 mg, BUN 23, creatinine 0.75, EGFR >80 mL.  Sodium mildly elevated at 146, potassium 4.9.  CMP otherwise normal.  Total cholesterol: 39, triglycerides 93, HDL 64, LDL 159.

## 2020-08-05 DIAGNOSIS — Z79899 Other long term (current) drug therapy: Secondary | ICD-10-CM | POA: Diagnosis not present

## 2020-08-05 DIAGNOSIS — R748 Abnormal levels of other serum enzymes: Secondary | ICD-10-CM | POA: Diagnosis not present

## 2020-08-05 DIAGNOSIS — M3320 Polymyositis, organ involvement unspecified: Secondary | ICD-10-CM | POA: Diagnosis not present

## 2020-08-05 DIAGNOSIS — M797 Fibromyalgia: Secondary | ICD-10-CM | POA: Diagnosis not present

## 2020-08-29 ENCOUNTER — Other Ambulatory Visit: Payer: Self-pay | Admitting: Cardiology

## 2020-08-29 DIAGNOSIS — I10 Essential (primary) hypertension: Secondary | ICD-10-CM

## 2020-10-25 DIAGNOSIS — Z79899 Other long term (current) drug therapy: Secondary | ICD-10-CM | POA: Diagnosis not present

## 2020-10-25 DIAGNOSIS — M25519 Pain in unspecified shoulder: Secondary | ICD-10-CM | POA: Diagnosis not present

## 2020-10-25 DIAGNOSIS — M8589 Other specified disorders of bone density and structure, multiple sites: Secondary | ICD-10-CM | POA: Diagnosis not present

## 2020-10-25 DIAGNOSIS — M3320 Polymyositis, organ involvement unspecified: Secondary | ICD-10-CM | POA: Diagnosis not present

## 2020-10-25 DIAGNOSIS — M6281 Muscle weakness (generalized): Secondary | ICD-10-CM | POA: Diagnosis not present

## 2020-11-04 DIAGNOSIS — M797 Fibromyalgia: Secondary | ICD-10-CM | POA: Diagnosis not present

## 2020-11-04 DIAGNOSIS — M3320 Polymyositis, organ involvement unspecified: Secondary | ICD-10-CM | POA: Diagnosis not present

## 2020-11-04 DIAGNOSIS — R531 Weakness: Secondary | ICD-10-CM | POA: Diagnosis not present

## 2020-11-04 DIAGNOSIS — Z7952 Long term (current) use of systemic steroids: Secondary | ICD-10-CM | POA: Diagnosis not present

## 2020-11-04 DIAGNOSIS — Z5189 Encounter for other specified aftercare: Secondary | ICD-10-CM | POA: Diagnosis not present

## 2020-11-04 DIAGNOSIS — Z79899 Other long term (current) drug therapy: Secondary | ICD-10-CM | POA: Diagnosis not present

## 2020-11-04 DIAGNOSIS — R94131 Abnormal electromyogram [EMG]: Secondary | ICD-10-CM | POA: Diagnosis not present

## 2020-11-04 DIAGNOSIS — R748 Abnormal levels of other serum enzymes: Secondary | ICD-10-CM | POA: Diagnosis not present

## 2020-11-18 DIAGNOSIS — Z79899 Other long term (current) drug therapy: Secondary | ICD-10-CM | POA: Diagnosis not present

## 2020-11-18 DIAGNOSIS — M3320 Polymyositis, organ involvement unspecified: Secondary | ICD-10-CM | POA: Diagnosis not present

## 2020-12-03 DIAGNOSIS — H401132 Primary open-angle glaucoma, bilateral, moderate stage: Secondary | ICD-10-CM | POA: Diagnosis not present

## 2020-12-03 DIAGNOSIS — Z961 Presence of intraocular lens: Secondary | ICD-10-CM | POA: Diagnosis not present

## 2021-01-28 DIAGNOSIS — Z0000 Encounter for general adult medical examination without abnormal findings: Secondary | ICD-10-CM | POA: Diagnosis not present

## 2021-02-03 DIAGNOSIS — Z0000 Encounter for general adult medical examination without abnormal findings: Secondary | ICD-10-CM | POA: Diagnosis not present

## 2021-02-03 DIAGNOSIS — E559 Vitamin D deficiency, unspecified: Secondary | ICD-10-CM | POA: Diagnosis not present

## 2021-02-03 DIAGNOSIS — E876 Hypokalemia: Secondary | ICD-10-CM | POA: Diagnosis not present

## 2021-02-03 DIAGNOSIS — J0120 Acute ethmoidal sinusitis, unspecified: Secondary | ICD-10-CM | POA: Diagnosis not present

## 2021-02-24 ENCOUNTER — Other Ambulatory Visit: Payer: Self-pay | Admitting: Cardiology

## 2021-02-24 DIAGNOSIS — I4821 Permanent atrial fibrillation: Secondary | ICD-10-CM

## 2021-03-07 DIAGNOSIS — E876 Hypokalemia: Secondary | ICD-10-CM | POA: Diagnosis not present

## 2021-03-07 DIAGNOSIS — M1712 Unilateral primary osteoarthritis, left knee: Secondary | ICD-10-CM | POA: Diagnosis not present

## 2021-03-07 DIAGNOSIS — M3320 Polymyositis, organ involvement unspecified: Secondary | ICD-10-CM | POA: Diagnosis not present

## 2021-03-07 DIAGNOSIS — E785 Hyperlipidemia, unspecified: Secondary | ICD-10-CM | POA: Diagnosis not present

## 2021-03-10 DIAGNOSIS — M3320 Polymyositis, organ involvement unspecified: Secondary | ICD-10-CM | POA: Diagnosis not present

## 2021-03-10 DIAGNOSIS — M6281 Muscle weakness (generalized): Secondary | ICD-10-CM | POA: Diagnosis not present

## 2021-03-10 DIAGNOSIS — M25519 Pain in unspecified shoulder: Secondary | ICD-10-CM | POA: Diagnosis not present

## 2021-03-10 DIAGNOSIS — Z79899 Other long term (current) drug therapy: Secondary | ICD-10-CM | POA: Diagnosis not present

## 2021-06-04 ENCOUNTER — Other Ambulatory Visit: Payer: Self-pay | Admitting: Cardiology

## 2021-06-04 DIAGNOSIS — I4821 Permanent atrial fibrillation: Secondary | ICD-10-CM

## 2021-06-04 DIAGNOSIS — I10 Essential (primary) hypertension: Secondary | ICD-10-CM

## 2021-06-06 DIAGNOSIS — H401132 Primary open-angle glaucoma, bilateral, moderate stage: Secondary | ICD-10-CM | POA: Diagnosis not present

## 2021-06-06 DIAGNOSIS — H524 Presbyopia: Secondary | ICD-10-CM | POA: Diagnosis not present

## 2021-06-09 DIAGNOSIS — M3320 Polymyositis, organ involvement unspecified: Secondary | ICD-10-CM | POA: Diagnosis not present

## 2021-06-09 DIAGNOSIS — M1712 Unilateral primary osteoarthritis, left knee: Secondary | ICD-10-CM | POA: Diagnosis not present

## 2021-06-09 DIAGNOSIS — M810 Age-related osteoporosis without current pathological fracture: Secondary | ICD-10-CM | POA: Diagnosis not present

## 2021-06-09 DIAGNOSIS — Z79899 Other long term (current) drug therapy: Secondary | ICD-10-CM | POA: Diagnosis not present

## 2021-06-27 ENCOUNTER — Ambulatory Visit: Payer: Medicare Other | Admitting: Cardiology

## 2021-07-21 ENCOUNTER — Ambulatory Visit: Payer: Medicare Other | Admitting: Cardiology

## 2021-09-05 ENCOUNTER — Other Ambulatory Visit: Payer: Self-pay

## 2021-09-05 ENCOUNTER — Ambulatory Visit: Payer: Medicare Other | Admitting: Cardiology

## 2021-09-05 ENCOUNTER — Encounter: Payer: Self-pay | Admitting: Cardiology

## 2021-09-05 VITALS — BP 145/91 | HR 86 | Temp 97.8°F | Resp 16 | Ht 65.0 in | Wt 209.2 lb

## 2021-09-05 DIAGNOSIS — M797 Fibromyalgia: Secondary | ICD-10-CM

## 2021-09-05 DIAGNOSIS — I10 Essential (primary) hypertension: Secondary | ICD-10-CM

## 2021-09-05 DIAGNOSIS — R748 Abnormal levels of other serum enzymes: Secondary | ICD-10-CM

## 2021-09-05 DIAGNOSIS — I4821 Permanent atrial fibrillation: Secondary | ICD-10-CM | POA: Diagnosis not present

## 2021-09-05 DIAGNOSIS — E7800 Pure hypercholesterolemia, unspecified: Secondary | ICD-10-CM

## 2021-09-05 MED ORDER — SPIRONOLACTONE 25 MG PO TABS: 25.0000 mg | ORAL_TABLET | Freq: Every day | ORAL | 3 refills | Status: DC

## 2021-09-05 NOTE — Progress Notes (Signed)
Primary Physician/Referring:  Janie Morning, DO  [name], female    DOB: April 05, 1941, 81 y.o.   MRN: [id number]  Subjective   Chief Complaint  Patient presents with   Atrial Fibrillation   Follow-up    1 year   HPI: Nancy Sosa  is a 81 y.o. female  AAF with permanent atrial fibrillation, chronic dyspnea, hypertension, hyperlipidemia, Polymyositis and fibromyalgia with chronically elevated CPK, and chronic leg edema. Patient is here on a 1 year follow-up of permanent atrial fibrillation and hypertension.   She is presently doing well and except for her arthritis and fibromyalgia that bothers her, she is presently doing well.  No bleeding diathesis on Eliquis.  She is tolerating all her medications well.  She uses a walker to help walking at home.  States that she discontinued taking spironolactone 50 mg daily due to excessive diuresis especially bothering her at night.  Past Medical History:  Diagnosis Date   Arthritis    Asthma    also seasonal allergies/bronchitis   Barrett's esophagus    Bursitis of shoulder    Cataracts, bilateral    Dysrhythmia    chronic tachycardia.  nuclear stress test 03/31/11 for preop clearance for TKA --report on this chart from dr. Gwenlyn Perking ischemia, occas pvc,.   GERD (gastroesophageal reflux disease)    Hypercholesterolemia    Hypertension    pt was on benicar hct and metoprolol--but after her knee replacement surg oct 2012--while in rehab--her b/p stayed so low that the benicar hct was d/c--she still takes the metoprolol   S/P left knee arthroscopy    S/P right knee arthroscopy    Varicose veins     Past Surgical History:  Procedure Laterality Date   ABDOMINAL HYSTERECTOMY     CARDIOVERSION N/A 12/01/2014   Procedure: CARDIOVERSION;  Surgeon: Adrian Prows, MD;  Location: [facility];  Service: Cardiovascular;  Laterality: N/A;   CATARACT EXTRACTION, BILATERAL  12/06/2018   CHOLECYSTECTOMY     COLONOSCOPY      fibroids breast removed     fibroids hand removed     FRACTURE SURGERY     rt wrist orif   JOINT REPLACEMENT  06/05/11   right total knee arthroplasty   KNEE ARTHROSCOPY     bilateral   left foot bunionectomy     MUSCLE BIOPSY Left 03/20/2014   Procedure: LEFT QUADRICEPS MUSCLE BIOPSY;  Surgeon: Odis Hollingshead, MD;  Location: [facility];  Service: General;  Laterality: Left;   PATELLAR TENDON REPAIR  06/26/2011   Procedure: PATELLA TENDON REPAIR;  Surgeon: Gearlean Alf;  Location: [facility];  Service: Orthopedics;  Laterality: Right;  Repair of Extensor Mechanisim   PATELLAR TENDON REPAIR  07/28/2011   Procedure: PATELLA TENDON REPAIR;  Surgeon: Gearlean Alf;  Location: [facility];  Service: Orthopedics;  Laterality: Right;   TONSILLECTOMY     UPPER GI ENDOSCOPY     Social History   Tobacco Use   Smoking status: Never   Smokeless tobacco: Never  Substance Use Topics   Alcohol use: No    Alcohol/week: 0.0 standard drinks   Marital Status: Widowed   Review of Systems  Cardiovascular:  Positive for dyspnea on exertion and leg swelling. Negative for chest pain.  Gastrointestinal:  Negative for melena.  Objective  Blood pressure (!) 145/91, pulse 86, temperature 97.8 F (36.6 C), temperature source Temporal, resp. rate 16, height 5' 5"  (1.651 m), weight 209 lb 3.2 oz (  94.9 kg), SpO2 98 %. Body mass index is 34.81 kg/m. Vitals with BMI 09/05/2021 09/05/2021 06/28/2020  Height - 5' 5"  5' 5"   Weight - 209 lbs 3 oz 211 lbs  BMI - 69.62 95.28  Systolic 413 244 010  Diastolic 91 272 86  Pulse 86 82 70    Physical Exam Neck:     Vascular: No carotid bruit or JVD.  Cardiovascular:     Rate and Rhythm: Normal rate. Rhythm irregular.     Pulses:          Dorsalis pedis pulses are 1+ on the right side and 1+ on the left side.       Posterior tibial pulses are 0 on the right side and 0 on the left side.     Heart sounds: Normal heart sounds. No murmur heard.   No  gallop.  Pulmonary:     Effort: Pulmonary effort is normal.     Breath sounds: Normal breath sounds.  Abdominal:     General: Bowel sounds are normal.     Palpations: Abdomen is soft.  Musculoskeletal:        General: No swelling.   Radiology: No results found.  Laboratory examination:   External labs:  Labs 06/09/2021:  Potassium 3.4, BUN 13, creatinine 0.51, EGFR 91 mL, LFTs normal.  Hb 13.7/HCT 43.8, platelets 220.  Labs 03/07/2021:  Vitamin D 36.2.  A1c 5.5%.  TSH normal at 2.42.  Total cholesterol 245, triglycerides 112, HDL 51, LDL 172.   Medications   Current Outpatient Medications on File Prior to Visit  Medication Sig Dispense Refill   acetaminophen (TYLENOL) 325 MG tablet Take 650 mg by mouth every 4 (four) hours as needed. Pain      alendronate (FOSAMAX) 70 MG tablet Take 70 mg by mouth once a week.     amLODipine (NORVASC) 10 MG tablet TAKE 1 TABLET(10 MG) BY MOUTH DAILY 90 tablet 3   ELIQUIS 5 MG TABS tablet TAKE 1 TABLET(5 MG) BY MOUTH TWICE DAILY 180 tablet 1   ergocalciferol (VITAMIN D2) 1.25 MG (50000 UT) capsule Take 1 capsule by mouth once a week.     ezetimibe (ZETIA) 10 MG tablet Take 10 mg by mouth daily.     fluticasone (FLONASE) 50 MCG/ACT nasal spray Place 1 spray into both nostrils as needed.     latanoprost (XALATAN) 0.005 % ophthalmic solution INSTILL 1 DROP OU QHS     loratadine (CLARITIN) 10 MG tablet Take 10 mg by mouth daily as needed for allergies.      metoprolol tartrate (LOPRESSOR) 100 MG tablet TAKE 1 TABLET(100 MG) BY MOUTH TWICE DAILY 180 tablet 3   mycophenolate (CELLCEPT) 500 MG tablet Take 1,000 mg by mouth 2 (two) times daily.     No current facility-administered medications on file prior to visit.    Cardiac Studies:   Echocardiogram 10/22/2014: Left ventricle cavity is normal in size. Moderate concentric hypertrophy of the left ventricle. Low normal decrease in global wall motion. Visual EF is 50%. Calculated EF 64%. Left  atrial cavity is severely dilated and measures 5 cm.  Mild to moderate posteriorly directed mitral regurgitation. Mild mitral valve leaflet thickening. I cannot exclude mild prolapse of the anterior leaflet. Mild tricuspid regurgitation. No evidence of pulmonary hypertension. Mild pulmonic regurgitation. Insignificant pericardial effusion.  Lexiscan myoview stress test 10/12/2014: 1. The resting electrocardiogram demonstrated atrial fibrillation and normal resting conduction.  Poor R progression, cannot exclude anterior infarct old.  Nonspecific T  abnormality. Stress EKG is non-diagnostic for ischemia as it a pharmacologic stress using Lexiscan. Stress symptoms included dizziness. 2. Left ventricular cavity is noted to be normal on the rest and stress studies.  Additionally, the right ventricle is normal.  SPECT images demonstrate homogeneous tracer distribution throughout the myocardium.  The left ventricular ejection fraction was calculated or visually estimated to be 22% with global hypokinesis.  Clinical correlation is recommended.  Reduced ejection fraction may be an error due to difficulty in EKG gating due to atrial fibrillation. This is an intermediate risk  scan and clinical correlation is recommended.  EKG:  EKG 09/05/2021: Atrial fibrillation with controlled ventricular response at rate of 96 bpm, anteroseptal infarct old.  Nonspecific T abnormality.  No change from 06/28/2020 and 08/05/2019.  Assessment     ICD-10-CM   1. Permanent atrial fibrillation (HCC)  I48.21 EKG 12-Lead    2. Essential hypertension  I10 spironolactone (ALDACTONE) 25 MG tablet    3. Pure hypercholesterolemia  E78.00     4. Fibromyalgia  M79.7     5. Elevated CK  R74.8      CHA2DS2-VASc Score is 4.  Yearly risk of stroke: 4.8% (F, A, HTN).  Score of 1=0.6; 2=2.2; 3=3.2; 4=4.8; 5=7.2; 6=9.8; 7=>9.8) -(CHF; HTN; vasc disease DM,  Female = 1; Age <65 =0; 65-74 = 1,  >75 =2; stroke/embolism= 2).   Meds ordered  this encounter  Medications   spironolactone (ALDACTONE) 25 MG tablet    Sig: Take 1 tablet (25 mg total) by mouth daily.    Dispense:  90 tablet    Refill:  3   Medications Discontinued During This Encounter  Medication Reason   azaTHIOprine (IMURAN) 50 MG tablet    ezetimibe (ZETIA) 10 MG tablet    pantoprazole (PROTONIX) 40 MG tablet    predniSONE (DELTASONE) 5 MG tablet    Vitamin D, Ergocalciferol, (DRISDOL) 1.25 MG (50000 UNIT) CAPS capsule    amLODipine (NORVASC) 10 MG tablet    spironolactone (ALDACTONE) 50 MG tablet      Recommendations:   Nancy Sosa  Is a 81 y.o. AAF with permanent atrial fibrillation, chronic dyspnea, hypertension, hyperlipidemia, Polymyositis and fibromyalgia  with chronically elevated CPK, and chronic leg edema. Patient is here on a annual visit for permanent atrial fibrillation and hypertension.  She is presently doing well and heart rate is well controlled and blood pressure is also well controlled on present medical regimen.  No clinical evidence to support acute diastolic heart failure.  She has trace bilateral leg edema, no JVD and heart rate is also well controlled.  Her blood pressure was elevated today, about a month ago she discontinued spironolactone due to excessive diuresis.  Advised her to take the medication as soon as she wakes up in the morning to avoid effects at night.  I am concerned about chronic diastolic heart failure exacerbation and worsening of leg edema, advised her to restart spironolactone but at half the dose that is from 50 mg reduce it to 25 mg daily.  She will continue to monitor her blood pressure closely.  External labs reviewed, renal function has remained stable.  I reviewed her labs, her LDL is markedly elevated however we are unable to use any statin due to her chronically elevated CK enzymes and fibromyalgia.  She is presently on Zetia alone.  As she does not have any known vascular disease, not sure that we should  try PCSK9 inhibitors.  I will see her back  in a year.  Labs have been stable.  No bleeding diathesis on Eliquis.   Adrian Prows, MD, [facility] 09/05/2021, 1:58 PM Office: [phone] Pager: [phone]

## 2021-09-06 ENCOUNTER — Telehealth: Payer: Self-pay

## 2021-09-06 DIAGNOSIS — E785 Hyperlipidemia, unspecified: Secondary | ICD-10-CM | POA: Diagnosis not present

## 2021-09-06 DIAGNOSIS — I10 Essential (primary) hypertension: Secondary | ICD-10-CM | POA: Diagnosis not present

## 2021-09-06 DIAGNOSIS — K219 Gastro-esophageal reflux disease without esophagitis: Secondary | ICD-10-CM | POA: Diagnosis not present

## 2021-09-06 DIAGNOSIS — E559 Vitamin D deficiency, unspecified: Secondary | ICD-10-CM | POA: Diagnosis not present

## 2021-09-06 NOTE — Telephone Encounter (Signed)
Pt called and stated that you instructed her to call us today with her BP reading. Her BP was 128/83 HR 87 at 12:00pm.

## 2021-09-13 ENCOUNTER — Other Ambulatory Visit: Payer: Self-pay | Admitting: Cardiology

## 2021-09-13 DIAGNOSIS — I4821 Permanent atrial fibrillation: Secondary | ICD-10-CM

## 2021-09-23 ENCOUNTER — Other Ambulatory Visit: Payer: Self-pay | Admitting: Cardiology

## 2021-09-23 DIAGNOSIS — I10 Essential (primary) hypertension: Secondary | ICD-10-CM

## 2021-10-07 DIAGNOSIS — Z79899 Other long term (current) drug therapy: Secondary | ICD-10-CM | POA: Diagnosis not present

## 2021-10-07 DIAGNOSIS — M3320 Polymyositis, organ involvement unspecified: Secondary | ICD-10-CM | POA: Diagnosis not present

## 2021-10-07 DIAGNOSIS — M810 Age-related osteoporosis without current pathological fracture: Secondary | ICD-10-CM | POA: Diagnosis not present

## 2021-10-07 DIAGNOSIS — M1712 Unilateral primary osteoarthritis, left knee: Secondary | ICD-10-CM | POA: Diagnosis not present

## 2021-12-16 DIAGNOSIS — H401132 Primary open-angle glaucoma, bilateral, moderate stage: Secondary | ICD-10-CM | POA: Diagnosis not present

## 2021-12-16 DIAGNOSIS — H3500 Unspecified background retinopathy: Secondary | ICD-10-CM | POA: Diagnosis not present

## 2021-12-16 DIAGNOSIS — Z961 Presence of intraocular lens: Secondary | ICD-10-CM | POA: Diagnosis not present

## 2022-01-09 DIAGNOSIS — M1712 Unilateral primary osteoarthritis, left knee: Secondary | ICD-10-CM | POA: Diagnosis not present

## 2022-01-09 DIAGNOSIS — M3320 Polymyositis, organ involvement unspecified: Secondary | ICD-10-CM | POA: Diagnosis not present

## 2022-01-09 DIAGNOSIS — Z79899 Other long term (current) drug therapy: Secondary | ICD-10-CM | POA: Diagnosis not present

## 2022-01-09 DIAGNOSIS — M6281 Muscle weakness (generalized): Secondary | ICD-10-CM | POA: Diagnosis not present

## 2022-01-12 DIAGNOSIS — M3320 Polymyositis, organ involvement unspecified: Secondary | ICD-10-CM | POA: Diagnosis not present

## 2022-01-12 DIAGNOSIS — Z888 Allergy status to other drugs, medicaments and biological substances status: Secondary | ICD-10-CM | POA: Diagnosis not present

## 2022-01-12 DIAGNOSIS — Z882 Allergy status to sulfonamides status: Secondary | ICD-10-CM | POA: Diagnosis not present

## 2022-01-12 DIAGNOSIS — Z880 Allergy status to penicillin: Secondary | ICD-10-CM | POA: Diagnosis not present

## 2022-01-12 DIAGNOSIS — Z881 Allergy status to other antibiotic agents status: Secondary | ICD-10-CM | POA: Diagnosis not present

## 2022-01-12 DIAGNOSIS — Z7969 Long term (current) use of other immunomodulators and immunosuppressants: Secondary | ICD-10-CM | POA: Diagnosis not present

## 2022-01-12 DIAGNOSIS — Z79899 Other long term (current) drug therapy: Secondary | ICD-10-CM | POA: Diagnosis not present

## 2022-02-02 DIAGNOSIS — I480 Paroxysmal atrial fibrillation: Secondary | ICD-10-CM | POA: Diagnosis not present

## 2022-02-02 DIAGNOSIS — E7801 Familial hypercholesterolemia: Secondary | ICD-10-CM | POA: Diagnosis not present

## 2022-02-02 DIAGNOSIS — M810 Age-related osteoporosis without current pathological fracture: Secondary | ICD-10-CM | POA: Diagnosis not present

## 2022-02-02 DIAGNOSIS — E559 Vitamin D deficiency, unspecified: Secondary | ICD-10-CM | POA: Diagnosis not present

## 2022-02-09 DIAGNOSIS — Z7901 Long term (current) use of anticoagulants: Secondary | ICD-10-CM | POA: Diagnosis not present

## 2022-02-09 DIAGNOSIS — I480 Paroxysmal atrial fibrillation: Secondary | ICD-10-CM | POA: Diagnosis not present

## 2022-02-09 DIAGNOSIS — M3320 Polymyositis, organ involvement unspecified: Secondary | ICD-10-CM | POA: Diagnosis not present

## 2022-02-09 DIAGNOSIS — Z0000 Encounter for general adult medical examination without abnormal findings: Secondary | ICD-10-CM | POA: Diagnosis not present

## 2022-03-15 ENCOUNTER — Other Ambulatory Visit: Payer: Self-pay | Admitting: Cardiology

## 2022-03-15 DIAGNOSIS — I4821 Permanent atrial fibrillation: Secondary | ICD-10-CM

## 2022-03-16 ENCOUNTER — Telehealth: Payer: Self-pay

## 2022-03-16 NOTE — Telephone Encounter (Signed)
VM 1110 : Patient left a VM.   Message is as follows:  "good morning my name is TULA SCHRYVER date of birth is [birth year] please call me back regarding the medication Eliquis. I'm a patient of Dr Jacinto Halim and my number is [phone] thank you."  I called patient back, she stated that her Eliquis was $300, I am having her come to the office to pick up a PA form and some samples. Patient is aware to bring proof of income when she turns in the form.

## 2022-03-16 NOTE — Telephone Encounter (Signed)
Patient picked up samples and PA form.

## 2022-04-11 DIAGNOSIS — M3320 Polymyositis, organ involvement unspecified: Secondary | ICD-10-CM | POA: Diagnosis not present

## 2022-04-11 DIAGNOSIS — M6281 Muscle weakness (generalized): Secondary | ICD-10-CM | POA: Diagnosis not present

## 2022-04-11 DIAGNOSIS — Z79899 Other long term (current) drug therapy: Secondary | ICD-10-CM | POA: Diagnosis not present

## 2022-04-11 DIAGNOSIS — M1712 Unilateral primary osteoarthritis, left knee: Secondary | ICD-10-CM | POA: Diagnosis not present

## 2022-04-20 DIAGNOSIS — Z882 Allergy status to sulfonamides status: Secondary | ICD-10-CM | POA: Diagnosis not present

## 2022-04-20 DIAGNOSIS — R0602 Shortness of breath: Secondary | ICD-10-CM | POA: Diagnosis not present

## 2022-04-20 DIAGNOSIS — Z888 Allergy status to other drugs, medicaments and biological substances status: Secondary | ICD-10-CM | POA: Diagnosis not present

## 2022-04-20 DIAGNOSIS — Z880 Allergy status to penicillin: Secondary | ICD-10-CM | POA: Diagnosis not present

## 2022-04-20 DIAGNOSIS — R238 Other skin changes: Secondary | ICD-10-CM | POA: Diagnosis not present

## 2022-04-20 DIAGNOSIS — Z881 Allergy status to other antibiotic agents status: Secondary | ICD-10-CM | POA: Diagnosis not present

## 2022-04-20 DIAGNOSIS — R748 Abnormal levels of other serum enzymes: Secondary | ICD-10-CM | POA: Diagnosis not present

## 2022-04-20 DIAGNOSIS — Z79899 Other long term (current) drug therapy: Secondary | ICD-10-CM | POA: Diagnosis not present

## 2022-04-20 DIAGNOSIS — Z79624 Long term (current) use of inhibitors of nucleotide synthesis: Secondary | ICD-10-CM | POA: Diagnosis not present

## 2022-04-20 DIAGNOSIS — M3320 Polymyositis, organ involvement unspecified: Secondary | ICD-10-CM | POA: Diagnosis not present

## 2022-05-24 DIAGNOSIS — R0602 Shortness of breath: Secondary | ICD-10-CM | POA: Diagnosis not present

## 2022-06-01 DIAGNOSIS — R0602 Shortness of breath: Secondary | ICD-10-CM | POA: Diagnosis not present

## 2022-06-01 DIAGNOSIS — R942 Abnormal results of pulmonary function studies: Secondary | ICD-10-CM | POA: Diagnosis not present

## 2022-06-09 DIAGNOSIS — R0609 Other forms of dyspnea: Secondary | ICD-10-CM | POA: Diagnosis not present

## 2022-06-26 DIAGNOSIS — Z8739 Personal history of other diseases of the musculoskeletal system and connective tissue: Secondary | ICD-10-CM | POA: Diagnosis not present

## 2022-06-26 DIAGNOSIS — M3320 Polymyositis, organ involvement unspecified: Secondary | ICD-10-CM | POA: Diagnosis not present

## 2022-06-26 DIAGNOSIS — Q791 Other congenital malformations of diaphragm: Secondary | ICD-10-CM | POA: Diagnosis not present

## 2022-06-27 DIAGNOSIS — H401132 Primary open-angle glaucoma, bilateral, moderate stage: Secondary | ICD-10-CM | POA: Diagnosis not present

## 2022-07-11 DIAGNOSIS — Z79899 Other long term (current) drug therapy: Secondary | ICD-10-CM | POA: Diagnosis not present

## 2022-07-11 DIAGNOSIS — M6281 Muscle weakness (generalized): Secondary | ICD-10-CM | POA: Diagnosis not present

## 2022-07-11 DIAGNOSIS — M3320 Polymyositis, organ involvement unspecified: Secondary | ICD-10-CM | POA: Diagnosis not present

## 2022-07-11 DIAGNOSIS — M1712 Unilateral primary osteoarthritis, left knee: Secondary | ICD-10-CM | POA: Diagnosis not present

## 2022-07-18 DIAGNOSIS — M3320 Polymyositis, organ involvement unspecified: Secondary | ICD-10-CM | POA: Diagnosis not present

## 2022-07-18 DIAGNOSIS — R0602 Shortness of breath: Secondary | ICD-10-CM | POA: Diagnosis not present

## 2022-07-18 DIAGNOSIS — R0683 Snoring: Secondary | ICD-10-CM | POA: Diagnosis not present

## 2022-07-18 DIAGNOSIS — R0609 Other forms of dyspnea: Secondary | ICD-10-CM | POA: Diagnosis not present

## 2022-07-22 ENCOUNTER — Other Ambulatory Visit: Payer: Self-pay | Admitting: Cardiology

## 2022-07-22 DIAGNOSIS — I4821 Permanent atrial fibrillation: Secondary | ICD-10-CM

## 2022-07-22 DIAGNOSIS — I10 Essential (primary) hypertension: Secondary | ICD-10-CM

## 2022-09-06 ENCOUNTER — Encounter: Payer: Self-pay | Admitting: Cardiology

## 2022-09-06 ENCOUNTER — Ambulatory Visit: Payer: Medicare Other | Admitting: Cardiology

## 2022-09-06 VITALS — BP 114/61 | HR 68 | Ht 65.0 in | Wt 199.6 lb

## 2022-09-06 DIAGNOSIS — I10 Essential (primary) hypertension: Secondary | ICD-10-CM | POA: Diagnosis not present

## 2022-09-06 DIAGNOSIS — I4821 Permanent atrial fibrillation: Secondary | ICD-10-CM

## 2022-09-06 NOTE — Progress Notes (Signed)
Primary Physician/Referring:  Janie Morning, DO  [name], female    DOB: 03/26/1941, 82 y.o.   MRN: [id number]  Subjective   No chief complaint on file.  HPI: Nancy Sosa  is a 82 y.o. female  AAF with permanent atrial fibrillation, chronic dyspnea, hypertension, hyperlipidemia, Polymyositis and fibromyalgia with chronically elevated CPK, and chronic leg edema. Patient is here on a 1 year follow-up of permanent atrial fibrillation and hypertension.   She is presently doing well.  She is having a hard time with Eliquis due to cost of the medication.  No bleeding diathesis.  She uses a walker to help walking at home.     Past Medical History:  Diagnosis Date   Arthritis    Asthma    also seasonal allergies/bronchitis   Barrett's esophagus    Bursitis of shoulder    Cataracts, bilateral    Dysrhythmia    chronic tachycardia.  nuclear stress test 03/31/11 for preop clearance for TKA --report on this chart from dr. Gwenlyn Perking ischemia, occas pvc,.   GERD (gastroesophageal reflux disease)    Hypercholesterolemia    Hypertension    pt was on benicar hct and metoprolol--but after her knee replacement surg oct 2012--while in rehab--her b/p stayed so low that the benicar hct was d/c--she still takes the metoprolol   S/P left knee arthroscopy    S/P right knee arthroscopy    Varicose veins     Social History   Tobacco Use   Smoking status: Never   Smokeless tobacco: Never  Substance Use Topics   Alcohol use: No    Alcohol/week: 0.0 standard drinks of alcohol   Marital Status: Widowed   Review of Systems  Cardiovascular:  Positive for dyspnea on exertion and leg swelling. Negative for chest pain.  Gastrointestinal:  Negative for melena.   Objective  Blood pressure 114/61, pulse 68, height 5\' 5"  (1.651 m), weight 199 lb 9.6 oz (90.5 kg), SpO2 96 %. Body mass index is 33.22 kg/m.    09/06/2022    2:27 PM 09/05/2021    1:40 PM 09/05/2021    1:39 PM   Vitals with BMI  Height 5\' 5"   5\' 5"   Weight 199 lbs 10 oz  209 lbs 3 oz  BMI 70.35  00.93  Systolic 818 299 371  Diastolic 61 91 696  Pulse 68 86 82    Physical Exam Neck:     Vascular: No carotid bruit or JVD.  Cardiovascular:     Rate and Rhythm: Normal rate. Rhythm irregular.     Pulses:          Dorsalis pedis pulses are 1+ on the right side and 1+ on the left side.       Posterior tibial pulses are 0 on the right side and 0 on the left side.     Heart sounds: Normal heart sounds. No murmur heard.    No gallop.  Pulmonary:     Effort: Pulmonary effort is normal.     Breath sounds: Normal breath sounds.  Abdominal:     General: Bowel sounds are normal.     Palpations: Abdomen is soft.  Musculoskeletal:     Right lower leg: Edema (trace ankle edema) present.     Left lower leg: Edema (1-2+ ankle edema) present.    Radiology:   Laboratory examination:   External labs:  Labs 02/04/2022:  Total cholesterol 243, triglycerides 117, HDL 62, LDL 160.  Non-HDL  cholesterol 181.  A1c 5.4%.  Labs 04/13/2022:  Hb 13.7/HCT 42.0, platelets 180, normal indicis.  Sodium 143, potassium 3.1, BUN 7, creatinine 0.55, EGFR 102 mL.  LFTs normal.  Sedimentation rate 2.0, aldolase 5.7, normal.  CPK 363, mildly elevated.  Medications   Current Outpatient Medications:    acetaminophen (TYLENOL) 325 MG tablet, Take 650 mg by mouth every 4 (four) hours as needed. Pain , Disp: , Rfl:    alendronate (FOSAMAX) 70 MG tablet, Take 70 mg by mouth once a week., Disp: , Rfl:    amLODipine (NORVASC) 10 MG tablet, TAKE 1 TABLET(10 MG) BY MOUTH DAILY, Disp: 90 tablet, Rfl: 3   ELIQUIS 5 MG TABS tablet, TAKE 1 TABLET(5 MG) BY MOUTH TWICE DAILY (Patient taking differently: Take 5 mg by mouth 2 (two) times daily.), Disp: 180 tablet, Rfl: 1   ergocalciferol (VITAMIN D2) 1.25 MG (50000 UT) capsule, Take 1 capsule by mouth once a week., Disp: , Rfl:    ezetimibe (ZETIA) 10 MG tablet, Take 10 mg by  mouth daily., Disp: , Rfl:    fluticasone (FLONASE) 50 MCG/ACT nasal spray, Place 1 spray into both nostrils as needed., Disp: , Rfl:    ibuprofen (ADVIL) 200 MG tablet, Take 200 mg by mouth every 6 (six) hours as needed., Disp: , Rfl:    latanoprost (XALATAN) 0.005 % ophthalmic solution, INSTILL 1 DROP OU QHS, Disp: , Rfl:    loratadine (CLARITIN) 10 MG tablet, Take 10 mg by mouth daily as needed for allergies. , Disp: , Rfl:    magnesium (MAGTAB) 84 MG (7MEQ) TBCR SR tablet, Take 84 mg by mouth., Disp: , Rfl:    metoprolol tartrate (LOPRESSOR) 100 MG tablet, Take 1 tablet (100 mg total) by mouth 2 (two) times daily., Disp: 180 tablet, Rfl: 0   mycophenolate (CELLCEPT) 500 MG tablet, Take 1,000 mg by mouth 2 (two) times daily., Disp: , Rfl:    spironolactone (ALDACTONE) 25 MG tablet, Take 1 tablet (25 mg total) by mouth daily. (Patient not taking: Reported on 09/06/2022), Disp: 90 tablet, Rfl: 3    Cardiac Studies:   Echocardiogram 10/22/2014: Left ventricle cavity is normal in size. Moderate concentric hypertrophy of the left ventricle. Low normal decrease in global wall motion. Visual EF is 50%. Calculated EF 64%. Left atrial cavity is severely dilated and measures 5 cm.  Mild to moderate posteriorly directed mitral regurgitation. Mild mitral valve leaflet thickening. I cannot exclude mild prolapse of the anterior leaflet. Mild tricuspid regurgitation. No evidence of pulmonary hypertension. Mild pulmonic regurgitation. Insignificant pericardial effusion.  Lexiscan myoview stress test 10/12/2014: 1. The resting electrocardiogram demonstrated atrial fibrillation and normal resting conduction.  Poor R progression, cannot exclude anterior infarct old.  Nonspecific T abnormality. Stress EKG is non-diagnostic for ischemia as it a pharmacologic stress using Lexiscan. Stress symptoms included dizziness. 2. Left ventricular cavity is noted to be normal on the rest and stress studies.  Additionally, the  right ventricle is normal.  SPECT images demonstrate homogeneous tracer distribution throughout the myocardium.  The left ventricular ejection fraction was calculated or visually estimated to be 22% with global hypokinesis.  Clinical correlation is recommended.  Reduced ejection fraction may be an error due to difficulty in EKG gating due to atrial fibrillation. This is an intermediate risk  scan and clinical correlation is recommended.  EKG:  EKG 09/06/2022: Atrial fibrillation with controlled ventricular response at the rate of 74 bpm, anteroseptal infarct old.  Nonspecific T abnormality.  Compared to 09/05/2021,  no change.   Assessment     ICD-10-CM   1. Permanent atrial fibrillation (HCC)  I48.21 EKG 12-Lead    2. Essential hypertension  I10      CHA2DS2-VASc Score is 4.  Yearly risk of stroke: 4.8% (F, A, HTN).  Score of 1=0.6; 2=2.2; 3=3.2; 4=4.8; 5=7.2; 6=9.8; 7=>9.8) -(CHF; HTN; vasc disease DM,  Female = 1; Age <65 =0; 65-74 = 1,  >75 =2; stroke/embolism= 2).   No orders of the defined types were placed in this encounter.  There are no discontinued medications.   Recommendations:   Marget Outten  Is a 82 y.o. AAF with permanent atrial fibrillation, chronic dyspnea, hypertension, hyperlipidemia, Polymyositis and fibromyalgia  with chronically elevated CPK, and mild chronic leg edema. Patient is here on a annual visit for permanent atrial fibrillation and hypertension.  1. Permanent atrial fibrillation (HCC) Rate well controlled.  No clinical evidence to support acute diastolic heart failure.  Patient is having difficulty due to cost of Eliquis, will try patient assistance.  2. Essential hypertension Blood pressure excellent control.  Her last office visit after she had stopped spironolactone, blood pressure had elevated and also she had developed leg edema and all of this is resolved.  Her blood pressure is now well-controlled.  Renal function has remained stable.  External  labs reviewed, renal function has remained stable.  I reviewed her labs, her LDL is markedly elevated however we are unable to use any statin due to her chronically elevated CK enzymes and fibromyalgia.  She is presently on Zetia alone.  As she does not have any known vascular disease.  I will see her back in a year.  Labs have been stable.  No bleeding diathesis on Eliquis.   Adrian Prows, MD, [facility] 09/06/2022, 2:43 PM Office: [phone] Pager: [phone]

## 2022-10-16 ENCOUNTER — Ambulatory Visit: Payer: Medicare Other | Admitting: Cardiology

## 2022-10-17 DIAGNOSIS — M3320 Polymyositis, organ involvement unspecified: Secondary | ICD-10-CM | POA: Diagnosis not present

## 2022-10-17 DIAGNOSIS — M6281 Muscle weakness (generalized): Secondary | ICD-10-CM | POA: Diagnosis not present

## 2022-10-17 DIAGNOSIS — Z79899 Other long term (current) drug therapy: Secondary | ICD-10-CM | POA: Diagnosis not present

## 2022-10-17 DIAGNOSIS — M1712 Unilateral primary osteoarthritis, left knee: Secondary | ICD-10-CM | POA: Diagnosis not present

## 2022-10-19 ENCOUNTER — Other Ambulatory Visit: Payer: Self-pay | Admitting: Cardiology

## 2022-10-19 DIAGNOSIS — I4821 Permanent atrial fibrillation: Secondary | ICD-10-CM

## 2022-10-26 DIAGNOSIS — M3320 Polymyositis, organ involvement unspecified: Secondary | ICD-10-CM | POA: Diagnosis not present

## 2022-10-26 DIAGNOSIS — Z79899 Other long term (current) drug therapy: Secondary | ICD-10-CM | POA: Diagnosis not present

## 2022-10-26 DIAGNOSIS — Z79624 Long term (current) use of inhibitors of nucleotide synthesis: Secondary | ICD-10-CM | POA: Diagnosis not present

## 2022-10-30 ENCOUNTER — Other Ambulatory Visit: Payer: Self-pay | Admitting: Cardiology

## 2022-10-30 DIAGNOSIS — I10 Essential (primary) hypertension: Secondary | ICD-10-CM

## 2022-10-30 DIAGNOSIS — I4821 Permanent atrial fibrillation: Secondary | ICD-10-CM

## 2022-11-03 DIAGNOSIS — J986 Disorders of diaphragm: Secondary | ICD-10-CM | POA: Diagnosis not present

## 2022-11-14 ENCOUNTER — Other Ambulatory Visit: Payer: Self-pay | Admitting: Cardiology

## 2022-11-14 DIAGNOSIS — I10 Essential (primary) hypertension: Secondary | ICD-10-CM

## 2022-11-30 DIAGNOSIS — Z7969 Long term (current) use of other immunomodulators and immunosuppressants: Secondary | ICD-10-CM | POA: Diagnosis not present

## 2022-11-30 DIAGNOSIS — Z01818 Encounter for other preprocedural examination: Secondary | ICD-10-CM | POA: Diagnosis not present

## 2022-11-30 DIAGNOSIS — Z7901 Long term (current) use of anticoagulants: Secondary | ICD-10-CM | POA: Diagnosis not present

## 2022-11-30 DIAGNOSIS — I4821 Permanent atrial fibrillation: Secondary | ICD-10-CM | POA: Diagnosis not present

## 2022-11-30 DIAGNOSIS — J986 Disorders of diaphragm: Secondary | ICD-10-CM | POA: Diagnosis not present

## 2022-11-30 DIAGNOSIS — Z791 Long term (current) use of non-steroidal anti-inflammatories (NSAID): Secondary | ICD-10-CM | POA: Diagnosis not present

## 2022-11-30 DIAGNOSIS — M1990 Unspecified osteoarthritis, unspecified site: Secondary | ICD-10-CM | POA: Diagnosis not present

## 2022-11-30 DIAGNOSIS — J45909 Unspecified asthma, uncomplicated: Secondary | ICD-10-CM | POA: Diagnosis not present

## 2022-11-30 DIAGNOSIS — J8283 Eosinophilic asthma: Secondary | ICD-10-CM | POA: Diagnosis not present

## 2022-11-30 DIAGNOSIS — E669 Obesity, unspecified: Secondary | ICD-10-CM | POA: Diagnosis not present

## 2022-11-30 DIAGNOSIS — I10 Essential (primary) hypertension: Secondary | ICD-10-CM | POA: Diagnosis not present

## 2022-11-30 DIAGNOSIS — M3320 Polymyositis, organ involvement unspecified: Secondary | ICD-10-CM | POA: Diagnosis not present

## 2022-11-30 DIAGNOSIS — E876 Hypokalemia: Secondary | ICD-10-CM | POA: Diagnosis not present

## 2022-11-30 DIAGNOSIS — Z6832 Body mass index (BMI) 32.0-32.9, adult: Secondary | ICD-10-CM | POA: Diagnosis not present

## 2022-12-07 DIAGNOSIS — G8918 Other acute postprocedural pain: Secondary | ICD-10-CM | POA: Diagnosis not present

## 2022-12-07 DIAGNOSIS — J939 Pneumothorax, unspecified: Secondary | ICD-10-CM | POA: Diagnosis not present

## 2022-12-07 DIAGNOSIS — Z79899 Other long term (current) drug therapy: Secondary | ICD-10-CM | POA: Diagnosis not present

## 2022-12-07 DIAGNOSIS — J9811 Atelectasis: Secondary | ICD-10-CM | POA: Diagnosis not present

## 2022-12-07 DIAGNOSIS — Z7901 Long term (current) use of anticoagulants: Secondary | ICD-10-CM | POA: Diagnosis not present

## 2022-12-07 DIAGNOSIS — E669 Obesity, unspecified: Secondary | ICD-10-CM | POA: Diagnosis not present

## 2022-12-07 DIAGNOSIS — I4891 Unspecified atrial fibrillation: Secondary | ICD-10-CM | POA: Diagnosis not present

## 2022-12-07 DIAGNOSIS — R918 Other nonspecific abnormal finding of lung field: Secondary | ICD-10-CM | POA: Diagnosis not present

## 2022-12-07 DIAGNOSIS — Z7983 Long term (current) use of bisphosphonates: Secondary | ICD-10-CM | POA: Diagnosis not present

## 2022-12-07 DIAGNOSIS — E785 Hyperlipidemia, unspecified: Secondary | ICD-10-CM | POA: Diagnosis not present

## 2022-12-07 DIAGNOSIS — J986 Disorders of diaphragm: Secondary | ICD-10-CM | POA: Diagnosis not present

## 2022-12-07 DIAGNOSIS — Z6832 Body mass index (BMI) 32.0-32.9, adult: Secondary | ICD-10-CM | POA: Diagnosis not present

## 2022-12-07 DIAGNOSIS — J948 Other specified pleural conditions: Secondary | ICD-10-CM | POA: Diagnosis not present

## 2022-12-07 DIAGNOSIS — M3320 Polymyositis, organ involvement unspecified: Secondary | ICD-10-CM | POA: Diagnosis not present

## 2022-12-07 DIAGNOSIS — J90 Pleural effusion, not elsewhere classified: Secondary | ICD-10-CM | POA: Diagnosis not present

## 2022-12-07 DIAGNOSIS — I4821 Permanent atrial fibrillation: Secondary | ICD-10-CM | POA: Diagnosis not present

## 2022-12-07 DIAGNOSIS — Z8616 Personal history of COVID-19: Secondary | ICD-10-CM | POA: Diagnosis not present

## 2022-12-07 DIAGNOSIS — E7800 Pure hypercholesterolemia, unspecified: Secondary | ICD-10-CM | POA: Diagnosis not present

## 2022-12-07 DIAGNOSIS — I10 Essential (primary) hypertension: Secondary | ICD-10-CM | POA: Diagnosis not present

## 2022-12-07 DIAGNOSIS — Q791 Other congenital malformations of diaphragm: Secondary | ICD-10-CM | POA: Diagnosis not present

## 2022-12-07 DIAGNOSIS — K219 Gastro-esophageal reflux disease without esophagitis: Secondary | ICD-10-CM | POA: Diagnosis not present

## 2022-12-07 DIAGNOSIS — Z9689 Presence of other specified functional implants: Secondary | ICD-10-CM | POA: Diagnosis not present

## 2022-12-13 DIAGNOSIS — Z48812 Encounter for surgical aftercare following surgery on the circulatory system: Secondary | ICD-10-CM | POA: Diagnosis not present

## 2022-12-13 DIAGNOSIS — Z96651 Presence of right artificial knee joint: Secondary | ICD-10-CM | POA: Diagnosis not present

## 2022-12-13 DIAGNOSIS — Z7901 Long term (current) use of anticoagulants: Secondary | ICD-10-CM | POA: Diagnosis not present

## 2022-12-13 DIAGNOSIS — E7800 Pure hypercholesterolemia, unspecified: Secondary | ICD-10-CM | POA: Diagnosis not present

## 2022-12-13 DIAGNOSIS — Q791 Other congenital malformations of diaphragm: Secondary | ICD-10-CM | POA: Diagnosis not present

## 2022-12-13 DIAGNOSIS — K219 Gastro-esophageal reflux disease without esophagitis: Secondary | ICD-10-CM | POA: Diagnosis not present

## 2022-12-13 DIAGNOSIS — Z79891 Long term (current) use of opiate analgesic: Secondary | ICD-10-CM | POA: Diagnosis not present

## 2022-12-13 DIAGNOSIS — I482 Chronic atrial fibrillation, unspecified: Secondary | ICD-10-CM | POA: Diagnosis not present

## 2022-12-13 DIAGNOSIS — I10 Essential (primary) hypertension: Secondary | ICD-10-CM | POA: Diagnosis not present

## 2022-12-13 DIAGNOSIS — Z8616 Personal history of COVID-19: Secondary | ICD-10-CM | POA: Diagnosis not present

## 2022-12-13 DIAGNOSIS — M3320 Polymyositis, organ involvement unspecified: Secondary | ICD-10-CM | POA: Diagnosis not present

## 2022-12-13 DIAGNOSIS — J986 Disorders of diaphragm: Secondary | ICD-10-CM | POA: Diagnosis not present

## 2022-12-18 DIAGNOSIS — I480 Paroxysmal atrial fibrillation: Secondary | ICD-10-CM | POA: Diagnosis not present

## 2022-12-18 DIAGNOSIS — M3320 Polymyositis, organ involvement unspecified: Secondary | ICD-10-CM | POA: Diagnosis not present

## 2022-12-18 DIAGNOSIS — Z09 Encounter for follow-up examination after completed treatment for conditions other than malignant neoplasm: Secondary | ICD-10-CM | POA: Diagnosis not present

## 2022-12-18 DIAGNOSIS — Z7901 Long term (current) use of anticoagulants: Secondary | ICD-10-CM | POA: Diagnosis not present

## 2022-12-19 DIAGNOSIS — Q791 Other congenital malformations of diaphragm: Secondary | ICD-10-CM | POA: Diagnosis not present

## 2022-12-19 DIAGNOSIS — J986 Disorders of diaphragm: Secondary | ICD-10-CM | POA: Diagnosis not present

## 2022-12-19 DIAGNOSIS — Z8616 Personal history of COVID-19: Secondary | ICD-10-CM | POA: Diagnosis not present

## 2022-12-19 DIAGNOSIS — Z7901 Long term (current) use of anticoagulants: Secondary | ICD-10-CM | POA: Diagnosis not present

## 2022-12-19 DIAGNOSIS — K219 Gastro-esophageal reflux disease without esophagitis: Secondary | ICD-10-CM | POA: Diagnosis not present

## 2022-12-19 DIAGNOSIS — E7800 Pure hypercholesterolemia, unspecified: Secondary | ICD-10-CM | POA: Diagnosis not present

## 2022-12-19 DIAGNOSIS — I10 Essential (primary) hypertension: Secondary | ICD-10-CM | POA: Diagnosis not present

## 2022-12-19 DIAGNOSIS — Z96651 Presence of right artificial knee joint: Secondary | ICD-10-CM | POA: Diagnosis not present

## 2022-12-19 DIAGNOSIS — I482 Chronic atrial fibrillation, unspecified: Secondary | ICD-10-CM | POA: Diagnosis not present

## 2022-12-19 DIAGNOSIS — Z48812 Encounter for surgical aftercare following surgery on the circulatory system: Secondary | ICD-10-CM | POA: Diagnosis not present

## 2022-12-19 DIAGNOSIS — Z79891 Long term (current) use of opiate analgesic: Secondary | ICD-10-CM | POA: Diagnosis not present

## 2022-12-19 DIAGNOSIS — M3320 Polymyositis, organ involvement unspecified: Secondary | ICD-10-CM | POA: Diagnosis not present

## 2022-12-22 DIAGNOSIS — Z9889 Other specified postprocedural states: Secondary | ICD-10-CM | POA: Diagnosis not present

## 2022-12-22 DIAGNOSIS — J986 Disorders of diaphragm: Secondary | ICD-10-CM | POA: Diagnosis not present

## 2022-12-22 DIAGNOSIS — J90 Pleural effusion, not elsewhere classified: Secondary | ICD-10-CM | POA: Diagnosis not present

## 2022-12-23 DIAGNOSIS — I10 Essential (primary) hypertension: Secondary | ICD-10-CM | POA: Diagnosis not present

## 2022-12-23 DIAGNOSIS — M3320 Polymyositis, organ involvement unspecified: Secondary | ICD-10-CM | POA: Diagnosis not present

## 2022-12-23 DIAGNOSIS — Z79891 Long term (current) use of opiate analgesic: Secondary | ICD-10-CM | POA: Diagnosis not present

## 2022-12-23 DIAGNOSIS — Z48812 Encounter for surgical aftercare following surgery on the circulatory system: Secondary | ICD-10-CM | POA: Diagnosis not present

## 2022-12-23 DIAGNOSIS — I482 Chronic atrial fibrillation, unspecified: Secondary | ICD-10-CM | POA: Diagnosis not present

## 2022-12-23 DIAGNOSIS — K219 Gastro-esophageal reflux disease without esophagitis: Secondary | ICD-10-CM | POA: Diagnosis not present

## 2022-12-23 DIAGNOSIS — Z8616 Personal history of COVID-19: Secondary | ICD-10-CM | POA: Diagnosis not present

## 2022-12-23 DIAGNOSIS — Z7901 Long term (current) use of anticoagulants: Secondary | ICD-10-CM | POA: Diagnosis not present

## 2022-12-23 DIAGNOSIS — J986 Disorders of diaphragm: Secondary | ICD-10-CM | POA: Diagnosis not present

## 2022-12-23 DIAGNOSIS — Z96651 Presence of right artificial knee joint: Secondary | ICD-10-CM | POA: Diagnosis not present

## 2022-12-23 DIAGNOSIS — Q791 Other congenital malformations of diaphragm: Secondary | ICD-10-CM | POA: Diagnosis not present

## 2022-12-23 DIAGNOSIS — E7800 Pure hypercholesterolemia, unspecified: Secondary | ICD-10-CM | POA: Diagnosis not present

## 2022-12-26 DIAGNOSIS — Z7901 Long term (current) use of anticoagulants: Secondary | ICD-10-CM | POA: Diagnosis not present

## 2022-12-26 DIAGNOSIS — J986 Disorders of diaphragm: Secondary | ICD-10-CM | POA: Diagnosis not present

## 2022-12-26 DIAGNOSIS — K219 Gastro-esophageal reflux disease without esophagitis: Secondary | ICD-10-CM | POA: Diagnosis not present

## 2022-12-26 DIAGNOSIS — I482 Chronic atrial fibrillation, unspecified: Secondary | ICD-10-CM | POA: Diagnosis not present

## 2022-12-26 DIAGNOSIS — Z8616 Personal history of COVID-19: Secondary | ICD-10-CM | POA: Diagnosis not present

## 2022-12-26 DIAGNOSIS — Z79891 Long term (current) use of opiate analgesic: Secondary | ICD-10-CM | POA: Diagnosis not present

## 2022-12-26 DIAGNOSIS — E7800 Pure hypercholesterolemia, unspecified: Secondary | ICD-10-CM | POA: Diagnosis not present

## 2022-12-26 DIAGNOSIS — Q791 Other congenital malformations of diaphragm: Secondary | ICD-10-CM | POA: Diagnosis not present

## 2022-12-26 DIAGNOSIS — Z48812 Encounter for surgical aftercare following surgery on the circulatory system: Secondary | ICD-10-CM | POA: Diagnosis not present

## 2022-12-26 DIAGNOSIS — Z96651 Presence of right artificial knee joint: Secondary | ICD-10-CM | POA: Diagnosis not present

## 2022-12-26 DIAGNOSIS — M3320 Polymyositis, organ involvement unspecified: Secondary | ICD-10-CM | POA: Diagnosis not present

## 2022-12-26 DIAGNOSIS — I10 Essential (primary) hypertension: Secondary | ICD-10-CM | POA: Diagnosis not present

## 2022-12-28 DIAGNOSIS — Z7901 Long term (current) use of anticoagulants: Secondary | ICD-10-CM | POA: Diagnosis not present

## 2022-12-28 DIAGNOSIS — Z48812 Encounter for surgical aftercare following surgery on the circulatory system: Secondary | ICD-10-CM | POA: Diagnosis not present

## 2022-12-28 DIAGNOSIS — I10 Essential (primary) hypertension: Secondary | ICD-10-CM | POA: Diagnosis not present

## 2022-12-28 DIAGNOSIS — E7800 Pure hypercholesterolemia, unspecified: Secondary | ICD-10-CM | POA: Diagnosis not present

## 2022-12-28 DIAGNOSIS — Z96651 Presence of right artificial knee joint: Secondary | ICD-10-CM | POA: Diagnosis not present

## 2022-12-28 DIAGNOSIS — J986 Disorders of diaphragm: Secondary | ICD-10-CM | POA: Diagnosis not present

## 2022-12-28 DIAGNOSIS — M3320 Polymyositis, organ involvement unspecified: Secondary | ICD-10-CM | POA: Diagnosis not present

## 2022-12-28 DIAGNOSIS — K219 Gastro-esophageal reflux disease without esophagitis: Secondary | ICD-10-CM | POA: Diagnosis not present

## 2022-12-28 DIAGNOSIS — I482 Chronic atrial fibrillation, unspecified: Secondary | ICD-10-CM | POA: Diagnosis not present

## 2022-12-28 DIAGNOSIS — Z79891 Long term (current) use of opiate analgesic: Secondary | ICD-10-CM | POA: Diagnosis not present

## 2022-12-28 DIAGNOSIS — Q791 Other congenital malformations of diaphragm: Secondary | ICD-10-CM | POA: Diagnosis not present

## 2022-12-28 DIAGNOSIS — Z8616 Personal history of COVID-19: Secondary | ICD-10-CM | POA: Diagnosis not present

## 2023-01-02 DIAGNOSIS — Z8616 Personal history of COVID-19: Secondary | ICD-10-CM | POA: Diagnosis not present

## 2023-01-02 DIAGNOSIS — Z7901 Long term (current) use of anticoagulants: Secondary | ICD-10-CM | POA: Diagnosis not present

## 2023-01-02 DIAGNOSIS — I10 Essential (primary) hypertension: Secondary | ICD-10-CM | POA: Diagnosis not present

## 2023-01-02 DIAGNOSIS — Z96651 Presence of right artificial knee joint: Secondary | ICD-10-CM | POA: Diagnosis not present

## 2023-01-02 DIAGNOSIS — E7800 Pure hypercholesterolemia, unspecified: Secondary | ICD-10-CM | POA: Diagnosis not present

## 2023-01-02 DIAGNOSIS — M3320 Polymyositis, organ involvement unspecified: Secondary | ICD-10-CM | POA: Diagnosis not present

## 2023-01-02 DIAGNOSIS — Z79891 Long term (current) use of opiate analgesic: Secondary | ICD-10-CM | POA: Diagnosis not present

## 2023-01-02 DIAGNOSIS — Z48812 Encounter for surgical aftercare following surgery on the circulatory system: Secondary | ICD-10-CM | POA: Diagnosis not present

## 2023-01-02 DIAGNOSIS — J986 Disorders of diaphragm: Secondary | ICD-10-CM | POA: Diagnosis not present

## 2023-01-02 DIAGNOSIS — K219 Gastro-esophageal reflux disease without esophagitis: Secondary | ICD-10-CM | POA: Diagnosis not present

## 2023-01-02 DIAGNOSIS — I482 Chronic atrial fibrillation, unspecified: Secondary | ICD-10-CM | POA: Diagnosis not present

## 2023-01-02 DIAGNOSIS — Q791 Other congenital malformations of diaphragm: Secondary | ICD-10-CM | POA: Diagnosis not present

## 2023-01-04 DIAGNOSIS — Z96651 Presence of right artificial knee joint: Secondary | ICD-10-CM | POA: Diagnosis not present

## 2023-01-04 DIAGNOSIS — J986 Disorders of diaphragm: Secondary | ICD-10-CM | POA: Diagnosis not present

## 2023-01-04 DIAGNOSIS — Z79891 Long term (current) use of opiate analgesic: Secondary | ICD-10-CM | POA: Diagnosis not present

## 2023-01-04 DIAGNOSIS — Z7901 Long term (current) use of anticoagulants: Secondary | ICD-10-CM | POA: Diagnosis not present

## 2023-01-04 DIAGNOSIS — M3320 Polymyositis, organ involvement unspecified: Secondary | ICD-10-CM | POA: Diagnosis not present

## 2023-01-04 DIAGNOSIS — E7800 Pure hypercholesterolemia, unspecified: Secondary | ICD-10-CM | POA: Diagnosis not present

## 2023-01-04 DIAGNOSIS — Z8616 Personal history of COVID-19: Secondary | ICD-10-CM | POA: Diagnosis not present

## 2023-01-04 DIAGNOSIS — Q791 Other congenital malformations of diaphragm: Secondary | ICD-10-CM | POA: Diagnosis not present

## 2023-01-04 DIAGNOSIS — I482 Chronic atrial fibrillation, unspecified: Secondary | ICD-10-CM | POA: Diagnosis not present

## 2023-01-04 DIAGNOSIS — Z48812 Encounter for surgical aftercare following surgery on the circulatory system: Secondary | ICD-10-CM | POA: Diagnosis not present

## 2023-01-04 DIAGNOSIS — I10 Essential (primary) hypertension: Secondary | ICD-10-CM | POA: Diagnosis not present

## 2023-01-04 DIAGNOSIS — K219 Gastro-esophageal reflux disease without esophagitis: Secondary | ICD-10-CM | POA: Diagnosis not present

## 2023-01-09 DIAGNOSIS — E7800 Pure hypercholesterolemia, unspecified: Secondary | ICD-10-CM | POA: Diagnosis not present

## 2023-01-09 DIAGNOSIS — K219 Gastro-esophageal reflux disease without esophagitis: Secondary | ICD-10-CM | POA: Diagnosis not present

## 2023-01-09 DIAGNOSIS — I482 Chronic atrial fibrillation, unspecified: Secondary | ICD-10-CM | POA: Diagnosis not present

## 2023-01-09 DIAGNOSIS — Z96651 Presence of right artificial knee joint: Secondary | ICD-10-CM | POA: Diagnosis not present

## 2023-01-09 DIAGNOSIS — Z8616 Personal history of COVID-19: Secondary | ICD-10-CM | POA: Diagnosis not present

## 2023-01-09 DIAGNOSIS — Q791 Other congenital malformations of diaphragm: Secondary | ICD-10-CM | POA: Diagnosis not present

## 2023-01-09 DIAGNOSIS — Z48812 Encounter for surgical aftercare following surgery on the circulatory system: Secondary | ICD-10-CM | POA: Diagnosis not present

## 2023-01-09 DIAGNOSIS — Z79891 Long term (current) use of opiate analgesic: Secondary | ICD-10-CM | POA: Diagnosis not present

## 2023-01-09 DIAGNOSIS — I10 Essential (primary) hypertension: Secondary | ICD-10-CM | POA: Diagnosis not present

## 2023-01-09 DIAGNOSIS — M3320 Polymyositis, organ involvement unspecified: Secondary | ICD-10-CM | POA: Diagnosis not present

## 2023-01-09 DIAGNOSIS — J986 Disorders of diaphragm: Secondary | ICD-10-CM | POA: Diagnosis not present

## 2023-01-09 DIAGNOSIS — Z7901 Long term (current) use of anticoagulants: Secondary | ICD-10-CM | POA: Diagnosis not present

## 2023-01-12 DIAGNOSIS — Q791 Other congenital malformations of diaphragm: Secondary | ICD-10-CM | POA: Diagnosis not present

## 2023-01-12 DIAGNOSIS — Z8616 Personal history of COVID-19: Secondary | ICD-10-CM | POA: Diagnosis not present

## 2023-01-12 DIAGNOSIS — E7800 Pure hypercholesterolemia, unspecified: Secondary | ICD-10-CM | POA: Diagnosis not present

## 2023-01-12 DIAGNOSIS — Z96651 Presence of right artificial knee joint: Secondary | ICD-10-CM | POA: Diagnosis not present

## 2023-01-12 DIAGNOSIS — I10 Essential (primary) hypertension: Secondary | ICD-10-CM | POA: Diagnosis not present

## 2023-01-12 DIAGNOSIS — Z79891 Long term (current) use of opiate analgesic: Secondary | ICD-10-CM | POA: Diagnosis not present

## 2023-01-12 DIAGNOSIS — J986 Disorders of diaphragm: Secondary | ICD-10-CM | POA: Diagnosis not present

## 2023-01-12 DIAGNOSIS — Z48812 Encounter for surgical aftercare following surgery on the circulatory system: Secondary | ICD-10-CM | POA: Diagnosis not present

## 2023-01-12 DIAGNOSIS — I482 Chronic atrial fibrillation, unspecified: Secondary | ICD-10-CM | POA: Diagnosis not present

## 2023-01-12 DIAGNOSIS — M3320 Polymyositis, organ involvement unspecified: Secondary | ICD-10-CM | POA: Diagnosis not present

## 2023-01-12 DIAGNOSIS — Z7901 Long term (current) use of anticoagulants: Secondary | ICD-10-CM | POA: Diagnosis not present

## 2023-01-12 DIAGNOSIS — K219 Gastro-esophageal reflux disease without esophagitis: Secondary | ICD-10-CM | POA: Diagnosis not present

## 2023-01-17 DIAGNOSIS — H401132 Primary open-angle glaucoma, bilateral, moderate stage: Secondary | ICD-10-CM | POA: Diagnosis not present

## 2023-01-18 DIAGNOSIS — M3320 Polymyositis, organ involvement unspecified: Secondary | ICD-10-CM | POA: Diagnosis not present

## 2023-01-18 DIAGNOSIS — M8589 Other specified disorders of bone density and structure, multiple sites: Secondary | ICD-10-CM | POA: Diagnosis not present

## 2023-01-18 DIAGNOSIS — M1712 Unilateral primary osteoarthritis, left knee: Secondary | ICD-10-CM | POA: Diagnosis not present

## 2023-01-18 DIAGNOSIS — M25562 Pain in left knee: Secondary | ICD-10-CM | POA: Diagnosis not present

## 2023-01-18 DIAGNOSIS — Z79899 Other long term (current) drug therapy: Secondary | ICD-10-CM | POA: Diagnosis not present

## 2023-01-18 DIAGNOSIS — M6281 Muscle weakness (generalized): Secondary | ICD-10-CM | POA: Diagnosis not present

## 2023-01-19 DIAGNOSIS — Z79891 Long term (current) use of opiate analgesic: Secondary | ICD-10-CM | POA: Diagnosis not present

## 2023-01-19 DIAGNOSIS — Z7901 Long term (current) use of anticoagulants: Secondary | ICD-10-CM | POA: Diagnosis not present

## 2023-01-19 DIAGNOSIS — Z48812 Encounter for surgical aftercare following surgery on the circulatory system: Secondary | ICD-10-CM | POA: Diagnosis not present

## 2023-01-19 DIAGNOSIS — Z96651 Presence of right artificial knee joint: Secondary | ICD-10-CM | POA: Diagnosis not present

## 2023-01-19 DIAGNOSIS — Q791 Other congenital malformations of diaphragm: Secondary | ICD-10-CM | POA: Diagnosis not present

## 2023-01-19 DIAGNOSIS — K219 Gastro-esophageal reflux disease without esophagitis: Secondary | ICD-10-CM | POA: Diagnosis not present

## 2023-01-19 DIAGNOSIS — I482 Chronic atrial fibrillation, unspecified: Secondary | ICD-10-CM | POA: Diagnosis not present

## 2023-01-19 DIAGNOSIS — M3320 Polymyositis, organ involvement unspecified: Secondary | ICD-10-CM | POA: Diagnosis not present

## 2023-01-19 DIAGNOSIS — I10 Essential (primary) hypertension: Secondary | ICD-10-CM | POA: Diagnosis not present

## 2023-01-19 DIAGNOSIS — J986 Disorders of diaphragm: Secondary | ICD-10-CM | POA: Diagnosis not present

## 2023-01-19 DIAGNOSIS — E7800 Pure hypercholesterolemia, unspecified: Secondary | ICD-10-CM | POA: Diagnosis not present

## 2023-01-19 DIAGNOSIS — Z8616 Personal history of COVID-19: Secondary | ICD-10-CM | POA: Diagnosis not present

## 2023-01-24 DIAGNOSIS — M3320 Polymyositis, organ involvement unspecified: Secondary | ICD-10-CM | POA: Diagnosis not present

## 2023-01-24 DIAGNOSIS — Z79891 Long term (current) use of opiate analgesic: Secondary | ICD-10-CM | POA: Diagnosis not present

## 2023-01-24 DIAGNOSIS — Z96651 Presence of right artificial knee joint: Secondary | ICD-10-CM | POA: Diagnosis not present

## 2023-01-24 DIAGNOSIS — J986 Disorders of diaphragm: Secondary | ICD-10-CM | POA: Diagnosis not present

## 2023-01-24 DIAGNOSIS — Z48812 Encounter for surgical aftercare following surgery on the circulatory system: Secondary | ICD-10-CM | POA: Diagnosis not present

## 2023-01-24 DIAGNOSIS — Z7901 Long term (current) use of anticoagulants: Secondary | ICD-10-CM | POA: Diagnosis not present

## 2023-01-24 DIAGNOSIS — E7800 Pure hypercholesterolemia, unspecified: Secondary | ICD-10-CM | POA: Diagnosis not present

## 2023-01-24 DIAGNOSIS — Z8616 Personal history of COVID-19: Secondary | ICD-10-CM | POA: Diagnosis not present

## 2023-01-24 DIAGNOSIS — Q791 Other congenital malformations of diaphragm: Secondary | ICD-10-CM | POA: Diagnosis not present

## 2023-01-24 DIAGNOSIS — I482 Chronic atrial fibrillation, unspecified: Secondary | ICD-10-CM | POA: Diagnosis not present

## 2023-01-24 DIAGNOSIS — K219 Gastro-esophageal reflux disease without esophagitis: Secondary | ICD-10-CM | POA: Diagnosis not present

## 2023-01-24 DIAGNOSIS — I10 Essential (primary) hypertension: Secondary | ICD-10-CM | POA: Diagnosis not present

## 2023-01-31 DIAGNOSIS — E7800 Pure hypercholesterolemia, unspecified: Secondary | ICD-10-CM | POA: Diagnosis not present

## 2023-01-31 DIAGNOSIS — I482 Chronic atrial fibrillation, unspecified: Secondary | ICD-10-CM | POA: Diagnosis not present

## 2023-01-31 DIAGNOSIS — Z96651 Presence of right artificial knee joint: Secondary | ICD-10-CM | POA: Diagnosis not present

## 2023-01-31 DIAGNOSIS — Z7901 Long term (current) use of anticoagulants: Secondary | ICD-10-CM | POA: Diagnosis not present

## 2023-01-31 DIAGNOSIS — Z48812 Encounter for surgical aftercare following surgery on the circulatory system: Secondary | ICD-10-CM | POA: Diagnosis not present

## 2023-01-31 DIAGNOSIS — I10 Essential (primary) hypertension: Secondary | ICD-10-CM | POA: Diagnosis not present

## 2023-01-31 DIAGNOSIS — M3320 Polymyositis, organ involvement unspecified: Secondary | ICD-10-CM | POA: Diagnosis not present

## 2023-01-31 DIAGNOSIS — J986 Disorders of diaphragm: Secondary | ICD-10-CM | POA: Diagnosis not present

## 2023-01-31 DIAGNOSIS — Z79891 Long term (current) use of opiate analgesic: Secondary | ICD-10-CM | POA: Diagnosis not present

## 2023-01-31 DIAGNOSIS — Z8616 Personal history of COVID-19: Secondary | ICD-10-CM | POA: Diagnosis not present

## 2023-01-31 DIAGNOSIS — K219 Gastro-esophageal reflux disease without esophagitis: Secondary | ICD-10-CM | POA: Diagnosis not present

## 2023-01-31 DIAGNOSIS — Q791 Other congenital malformations of diaphragm: Secondary | ICD-10-CM | POA: Diagnosis not present

## 2023-02-07 DIAGNOSIS — I480 Paroxysmal atrial fibrillation: Secondary | ICD-10-CM | POA: Diagnosis not present

## 2023-02-07 DIAGNOSIS — Z79899 Other long term (current) drug therapy: Secondary | ICD-10-CM | POA: Diagnosis not present

## 2023-02-07 DIAGNOSIS — R7309 Other abnormal glucose: Secondary | ICD-10-CM | POA: Diagnosis not present

## 2023-02-07 DIAGNOSIS — E7800 Pure hypercholesterolemia, unspecified: Secondary | ICD-10-CM | POA: Diagnosis not present

## 2023-02-07 DIAGNOSIS — E559 Vitamin D deficiency, unspecified: Secondary | ICD-10-CM | POA: Diagnosis not present

## 2023-02-07 DIAGNOSIS — I10 Essential (primary) hypertension: Secondary | ICD-10-CM | POA: Diagnosis not present

## 2023-02-09 DIAGNOSIS — I10 Essential (primary) hypertension: Secondary | ICD-10-CM | POA: Diagnosis not present

## 2023-02-09 DIAGNOSIS — Z48812 Encounter for surgical aftercare following surgery on the circulatory system: Secondary | ICD-10-CM | POA: Diagnosis not present

## 2023-02-09 DIAGNOSIS — Z96651 Presence of right artificial knee joint: Secondary | ICD-10-CM | POA: Diagnosis not present

## 2023-02-09 DIAGNOSIS — E7800 Pure hypercholesterolemia, unspecified: Secondary | ICD-10-CM | POA: Diagnosis not present

## 2023-02-09 DIAGNOSIS — Z7901 Long term (current) use of anticoagulants: Secondary | ICD-10-CM | POA: Diagnosis not present

## 2023-02-09 DIAGNOSIS — I482 Chronic atrial fibrillation, unspecified: Secondary | ICD-10-CM | POA: Diagnosis not present

## 2023-02-09 DIAGNOSIS — M3320 Polymyositis, organ involvement unspecified: Secondary | ICD-10-CM | POA: Diagnosis not present

## 2023-02-09 DIAGNOSIS — Q791 Other congenital malformations of diaphragm: Secondary | ICD-10-CM | POA: Diagnosis not present

## 2023-02-09 DIAGNOSIS — J986 Disorders of diaphragm: Secondary | ICD-10-CM | POA: Diagnosis not present

## 2023-02-09 DIAGNOSIS — K219 Gastro-esophageal reflux disease without esophagitis: Secondary | ICD-10-CM | POA: Diagnosis not present

## 2023-02-09 DIAGNOSIS — Z79891 Long term (current) use of opiate analgesic: Secondary | ICD-10-CM | POA: Diagnosis not present

## 2023-02-09 DIAGNOSIS — Z8616 Personal history of COVID-19: Secondary | ICD-10-CM | POA: Diagnosis not present

## 2023-02-11 DIAGNOSIS — Z96651 Presence of right artificial knee joint: Secondary | ICD-10-CM | POA: Diagnosis not present

## 2023-02-11 DIAGNOSIS — J986 Disorders of diaphragm: Secondary | ICD-10-CM | POA: Diagnosis not present

## 2023-02-11 DIAGNOSIS — I10 Essential (primary) hypertension: Secondary | ICD-10-CM | POA: Diagnosis not present

## 2023-02-11 DIAGNOSIS — K219 Gastro-esophageal reflux disease without esophagitis: Secondary | ICD-10-CM | POA: Diagnosis not present

## 2023-02-11 DIAGNOSIS — M3320 Polymyositis, organ involvement unspecified: Secondary | ICD-10-CM | POA: Diagnosis not present

## 2023-02-11 DIAGNOSIS — Z9181 History of falling: Secondary | ICD-10-CM | POA: Diagnosis not present

## 2023-02-11 DIAGNOSIS — Z7901 Long term (current) use of anticoagulants: Secondary | ICD-10-CM | POA: Diagnosis not present

## 2023-02-11 DIAGNOSIS — E7800 Pure hypercholesterolemia, unspecified: Secondary | ICD-10-CM | POA: Diagnosis not present

## 2023-02-11 DIAGNOSIS — Z8616 Personal history of COVID-19: Secondary | ICD-10-CM | POA: Diagnosis not present

## 2023-02-11 DIAGNOSIS — I482 Chronic atrial fibrillation, unspecified: Secondary | ICD-10-CM | POA: Diagnosis not present

## 2023-02-11 DIAGNOSIS — Z79891 Long term (current) use of opiate analgesic: Secondary | ICD-10-CM | POA: Diagnosis not present

## 2023-02-11 DIAGNOSIS — Q791 Other congenital malformations of diaphragm: Secondary | ICD-10-CM | POA: Diagnosis not present

## 2023-02-12 DIAGNOSIS — I10 Essential (primary) hypertension: Secondary | ICD-10-CM | POA: Diagnosis not present

## 2023-02-14 DIAGNOSIS — Z7901 Long term (current) use of anticoagulants: Secondary | ICD-10-CM | POA: Diagnosis not present

## 2023-02-14 DIAGNOSIS — Z0000 Encounter for general adult medical examination without abnormal findings: Secondary | ICD-10-CM | POA: Diagnosis not present

## 2023-02-14 DIAGNOSIS — I480 Paroxysmal atrial fibrillation: Secondary | ICD-10-CM | POA: Diagnosis not present

## 2023-02-14 DIAGNOSIS — M3320 Polymyositis, organ involvement unspecified: Secondary | ICD-10-CM | POA: Diagnosis not present

## 2023-02-15 DIAGNOSIS — J986 Disorders of diaphragm: Secondary | ICD-10-CM | POA: Diagnosis not present

## 2023-02-15 DIAGNOSIS — M3320 Polymyositis, organ involvement unspecified: Secondary | ICD-10-CM | POA: Diagnosis not present

## 2023-02-15 DIAGNOSIS — Z8616 Personal history of COVID-19: Secondary | ICD-10-CM | POA: Diagnosis not present

## 2023-02-15 DIAGNOSIS — Z79891 Long term (current) use of opiate analgesic: Secondary | ICD-10-CM | POA: Diagnosis not present

## 2023-02-15 DIAGNOSIS — Z9181 History of falling: Secondary | ICD-10-CM | POA: Diagnosis not present

## 2023-02-15 DIAGNOSIS — Q791 Other congenital malformations of diaphragm: Secondary | ICD-10-CM | POA: Diagnosis not present

## 2023-02-15 DIAGNOSIS — E7800 Pure hypercholesterolemia, unspecified: Secondary | ICD-10-CM | POA: Diagnosis not present

## 2023-02-15 DIAGNOSIS — I482 Chronic atrial fibrillation, unspecified: Secondary | ICD-10-CM | POA: Diagnosis not present

## 2023-02-15 DIAGNOSIS — Z7901 Long term (current) use of anticoagulants: Secondary | ICD-10-CM | POA: Diagnosis not present

## 2023-02-15 DIAGNOSIS — Z96651 Presence of right artificial knee joint: Secondary | ICD-10-CM | POA: Diagnosis not present

## 2023-02-15 DIAGNOSIS — K219 Gastro-esophageal reflux disease without esophagitis: Secondary | ICD-10-CM | POA: Diagnosis not present

## 2023-02-15 DIAGNOSIS — I10 Essential (primary) hypertension: Secondary | ICD-10-CM | POA: Diagnosis not present

## 2023-02-20 DIAGNOSIS — Q791 Other congenital malformations of diaphragm: Secondary | ICD-10-CM | POA: Diagnosis not present

## 2023-02-20 DIAGNOSIS — M3320 Polymyositis, organ involvement unspecified: Secondary | ICD-10-CM | POA: Diagnosis not present

## 2023-02-20 DIAGNOSIS — J986 Disorders of diaphragm: Secondary | ICD-10-CM | POA: Diagnosis not present

## 2023-02-21 DIAGNOSIS — I10 Essential (primary) hypertension: Secondary | ICD-10-CM | POA: Diagnosis not present

## 2023-02-21 DIAGNOSIS — Q791 Other congenital malformations of diaphragm: Secondary | ICD-10-CM | POA: Diagnosis not present

## 2023-02-21 DIAGNOSIS — Z8616 Personal history of COVID-19: Secondary | ICD-10-CM | POA: Diagnosis not present

## 2023-02-21 DIAGNOSIS — I482 Chronic atrial fibrillation, unspecified: Secondary | ICD-10-CM | POA: Diagnosis not present

## 2023-02-21 DIAGNOSIS — J986 Disorders of diaphragm: Secondary | ICD-10-CM | POA: Diagnosis not present

## 2023-02-21 DIAGNOSIS — K219 Gastro-esophageal reflux disease without esophagitis: Secondary | ICD-10-CM | POA: Diagnosis not present

## 2023-02-21 DIAGNOSIS — E7800 Pure hypercholesterolemia, unspecified: Secondary | ICD-10-CM | POA: Diagnosis not present

## 2023-02-21 DIAGNOSIS — Z79891 Long term (current) use of opiate analgesic: Secondary | ICD-10-CM | POA: Diagnosis not present

## 2023-02-21 DIAGNOSIS — Z9181 History of falling: Secondary | ICD-10-CM | POA: Diagnosis not present

## 2023-02-21 DIAGNOSIS — M3320 Polymyositis, organ involvement unspecified: Secondary | ICD-10-CM | POA: Diagnosis not present

## 2023-02-21 DIAGNOSIS — Z7901 Long term (current) use of anticoagulants: Secondary | ICD-10-CM | POA: Diagnosis not present

## 2023-02-21 DIAGNOSIS — Z96651 Presence of right artificial knee joint: Secondary | ICD-10-CM | POA: Diagnosis not present

## 2023-03-01 DIAGNOSIS — K219 Gastro-esophageal reflux disease without esophagitis: Secondary | ICD-10-CM | POA: Diagnosis not present

## 2023-03-01 DIAGNOSIS — Z79891 Long term (current) use of opiate analgesic: Secondary | ICD-10-CM | POA: Diagnosis not present

## 2023-03-01 DIAGNOSIS — E7800 Pure hypercholesterolemia, unspecified: Secondary | ICD-10-CM | POA: Diagnosis not present

## 2023-03-01 DIAGNOSIS — Z7901 Long term (current) use of anticoagulants: Secondary | ICD-10-CM | POA: Diagnosis not present

## 2023-03-01 DIAGNOSIS — Z8616 Personal history of COVID-19: Secondary | ICD-10-CM | POA: Diagnosis not present

## 2023-03-01 DIAGNOSIS — J986 Disorders of diaphragm: Secondary | ICD-10-CM | POA: Diagnosis not present

## 2023-03-01 DIAGNOSIS — Z96651 Presence of right artificial knee joint: Secondary | ICD-10-CM | POA: Diagnosis not present

## 2023-03-01 DIAGNOSIS — Q791 Other congenital malformations of diaphragm: Secondary | ICD-10-CM | POA: Diagnosis not present

## 2023-03-01 DIAGNOSIS — I482 Chronic atrial fibrillation, unspecified: Secondary | ICD-10-CM | POA: Diagnosis not present

## 2023-03-01 DIAGNOSIS — Z9181 History of falling: Secondary | ICD-10-CM | POA: Diagnosis not present

## 2023-03-01 DIAGNOSIS — M3320 Polymyositis, organ involvement unspecified: Secondary | ICD-10-CM | POA: Diagnosis not present

## 2023-03-01 DIAGNOSIS — I10 Essential (primary) hypertension: Secondary | ICD-10-CM | POA: Diagnosis not present

## 2023-03-07 DIAGNOSIS — Z9181 History of falling: Secondary | ICD-10-CM | POA: Diagnosis not present

## 2023-03-07 DIAGNOSIS — Z8616 Personal history of COVID-19: Secondary | ICD-10-CM | POA: Diagnosis not present

## 2023-03-07 DIAGNOSIS — Q791 Other congenital malformations of diaphragm: Secondary | ICD-10-CM | POA: Diagnosis not present

## 2023-03-07 DIAGNOSIS — Z7901 Long term (current) use of anticoagulants: Secondary | ICD-10-CM | POA: Diagnosis not present

## 2023-03-07 DIAGNOSIS — E7800 Pure hypercholesterolemia, unspecified: Secondary | ICD-10-CM | POA: Diagnosis not present

## 2023-03-07 DIAGNOSIS — K219 Gastro-esophageal reflux disease without esophagitis: Secondary | ICD-10-CM | POA: Diagnosis not present

## 2023-03-07 DIAGNOSIS — I10 Essential (primary) hypertension: Secondary | ICD-10-CM | POA: Diagnosis not present

## 2023-03-07 DIAGNOSIS — M3320 Polymyositis, organ involvement unspecified: Secondary | ICD-10-CM | POA: Diagnosis not present

## 2023-03-07 DIAGNOSIS — Z79891 Long term (current) use of opiate analgesic: Secondary | ICD-10-CM | POA: Diagnosis not present

## 2023-03-07 DIAGNOSIS — I482 Chronic atrial fibrillation, unspecified: Secondary | ICD-10-CM | POA: Diagnosis not present

## 2023-03-07 DIAGNOSIS — J986 Disorders of diaphragm: Secondary | ICD-10-CM | POA: Diagnosis not present

## 2023-03-07 DIAGNOSIS — Z96651 Presence of right artificial knee joint: Secondary | ICD-10-CM | POA: Diagnosis not present

## 2023-04-24 DIAGNOSIS — M3320 Polymyositis, organ involvement unspecified: Secondary | ICD-10-CM | POA: Diagnosis not present

## 2023-04-24 DIAGNOSIS — Z79899 Other long term (current) drug therapy: Secondary | ICD-10-CM | POA: Diagnosis not present

## 2023-04-24 DIAGNOSIS — M25562 Pain in left knee: Secondary | ICD-10-CM | POA: Diagnosis not present

## 2023-05-03 DIAGNOSIS — Z79899 Other long term (current) drug therapy: Secondary | ICD-10-CM | POA: Diagnosis not present

## 2023-05-03 DIAGNOSIS — M3320 Polymyositis, organ involvement unspecified: Secondary | ICD-10-CM | POA: Diagnosis not present

## 2023-05-09 ENCOUNTER — Telehealth: Payer: Self-pay | Admitting: Cardiology

## 2023-05-09 NOTE — Telephone Encounter (Signed)
Please call pt to discuss stopping Eliquis. Please advise

## 2023-05-09 NOTE — Telephone Encounter (Signed)
Called re: Eliquis - Unable to get patient assistance due to needing to pay the deductible first and she never meets the deductible.   It is unaffordable at time and said she took her last tablet this morning.  She was planning to have her sister pick it up next Tuesday when she gets money.  She said she has a 30 day free trial card at her house that she has not used yet.  She will give that to her sister tomorrow to pick up the medication.  Adv it is not good to run out of this medicine, reviewed risk of stroke w afib and not taking blood thinner consistently.  Reviewed switching to warfarin is cheaper but then she would have to pay copays to have INR checked.     Re: amlodipine.   She said she reduced her weight and kept it off and does not use salt.  It had been 137/79.  She took it while on the phone.  148/88. She just realized she did not take the amlodipine today.  Adv to continue this medication.  She is trying to decrease her pharmacy cost but amlodipine costs $15 for 3 month supply.     Pt has received medication samples in the past and was hoping to receive more.   She is aware I will forward to Dr. Jacinto Halim for input/recommendations and then we will call her back.

## 2023-05-10 ENCOUNTER — Other Ambulatory Visit (HOSPITAL_COMMUNITY): Payer: Self-pay

## 2023-05-11 ENCOUNTER — Other Ambulatory Visit (HOSPITAL_COMMUNITY): Payer: Self-pay

## 2023-05-11 NOTE — Telephone Encounter (Signed)
Called patient back with Dr. Verl Dicker advisement. Patient stated she would stay on the eliquis, that she has depend on someone else for transportation and they are not always reliable. Encouraged patient to call back if she decides to change to warfarin.

## 2023-06-07 ENCOUNTER — Other Ambulatory Visit: Payer: Self-pay | Admitting: Cardiology

## 2023-06-07 DIAGNOSIS — I4821 Permanent atrial fibrillation: Secondary | ICD-10-CM

## 2023-06-07 NOTE — Telephone Encounter (Signed)
Eliquis 5mg  refill request received. Patient is 82 years old, weight-90.5kg, Crea-0.54 on 12/10/22 via [facility] from [facility], Diagnosis-Afib, and last seen by Dr. Jacinto Halim on 09/06/22. Dose is appropriate based on dosing criteria. Will send in refill to requested pharmacy.

## 2023-07-24 DIAGNOSIS — H401132 Primary open-angle glaucoma, bilateral, moderate stage: Secondary | ICD-10-CM | POA: Diagnosis not present

## 2023-07-24 DIAGNOSIS — Z961 Presence of intraocular lens: Secondary | ICD-10-CM | POA: Diagnosis not present

## 2023-09-13 DIAGNOSIS — Z79899 Other long term (current) drug therapy: Secondary | ICD-10-CM | POA: Diagnosis not present

## 2023-09-13 DIAGNOSIS — M3320 Polymyositis, organ involvement unspecified: Secondary | ICD-10-CM | POA: Diagnosis not present

## 2023-09-17 DIAGNOSIS — M3320 Polymyositis, organ involvement unspecified: Secondary | ICD-10-CM | POA: Diagnosis not present

## 2023-09-17 DIAGNOSIS — Z79899 Other long term (current) drug therapy: Secondary | ICD-10-CM | POA: Diagnosis not present

## 2023-10-01 ENCOUNTER — Ambulatory Visit: Payer: Medicare Other | Admitting: Cardiology

## 2023-10-03 ENCOUNTER — Ambulatory Visit: Payer: Self-pay | Admitting: Cardiology

## 2023-11-06 ENCOUNTER — Other Ambulatory Visit: Payer: Self-pay | Admitting: Cardiology

## 2023-11-06 DIAGNOSIS — I10 Essential (primary) hypertension: Secondary | ICD-10-CM

## 2023-11-10 ENCOUNTER — Other Ambulatory Visit: Payer: Self-pay | Admitting: Cardiology

## 2023-11-10 DIAGNOSIS — I4821 Permanent atrial fibrillation: Secondary | ICD-10-CM

## 2023-11-10 DIAGNOSIS — I10 Essential (primary) hypertension: Secondary | ICD-10-CM

## 2023-11-27 ENCOUNTER — Ambulatory Visit: Payer: Medicare Other | Admitting: Cardiology

## 2023-12-11 ENCOUNTER — Other Ambulatory Visit: Payer: Self-pay | Admitting: Cardiology

## 2023-12-11 DIAGNOSIS — I4821 Permanent atrial fibrillation: Secondary | ICD-10-CM

## 2023-12-11 NOTE — Telephone Encounter (Signed)
 Prescription refill request for Eliquis  received. Indication:afib Last office visit:upcoming Scr:0.54  5/24 Age: 83 Weight:90.5  kg  Prescription refilled

## 2023-12-18 DIAGNOSIS — M25562 Pain in left knee: Secondary | ICD-10-CM | POA: Diagnosis not present

## 2023-12-18 DIAGNOSIS — M6281 Muscle weakness (generalized): Secondary | ICD-10-CM | POA: Diagnosis not present

## 2023-12-18 DIAGNOSIS — M3320 Polymyositis, organ involvement unspecified: Secondary | ICD-10-CM | POA: Diagnosis not present

## 2023-12-18 DIAGNOSIS — M1712 Unilateral primary osteoarthritis, left knee: Secondary | ICD-10-CM | POA: Diagnosis not present

## 2023-12-18 DIAGNOSIS — M810 Age-related osteoporosis without current pathological fracture: Secondary | ICD-10-CM | POA: Diagnosis not present

## 2023-12-18 DIAGNOSIS — Z79899 Other long term (current) drug therapy: Secondary | ICD-10-CM | POA: Diagnosis not present

## 2023-12-27 DIAGNOSIS — R269 Unspecified abnormalities of gait and mobility: Secondary | ICD-10-CM | POA: Diagnosis not present

## 2023-12-27 DIAGNOSIS — D84821 Immunodeficiency due to drugs: Secondary | ICD-10-CM | POA: Diagnosis not present

## 2024-01-22 ENCOUNTER — Encounter: Payer: Self-pay | Admitting: Cardiology

## 2024-01-22 ENCOUNTER — Telehealth: Payer: Self-pay | Admitting: *Deleted

## 2024-01-22 ENCOUNTER — Ambulatory Visit: Payer: Self-pay | Attending: Cardiology | Admitting: Cardiology

## 2024-01-22 VITALS — BP 104/68 | HR 90 | Resp 16 | Ht 65.0 in | Wt 195.8 lb

## 2024-01-22 DIAGNOSIS — I4821 Permanent atrial fibrillation: Secondary | ICD-10-CM

## 2024-01-22 DIAGNOSIS — I10 Essential (primary) hypertension: Secondary | ICD-10-CM

## 2024-01-22 MED ORDER — APIXABAN 5 MG PO TABS: 5.0000 mg | ORAL_TABLET | Freq: Two times a day (BID) | ORAL | 0 refills | Status: DC

## 2024-01-22 MED ORDER — APIXABAN 5 MG PO TABS: 5.0000 mg | ORAL_TABLET | Freq: Two times a day (BID) | ORAL | 0 refills | Status: AC

## 2024-01-22 NOTE — Progress Notes (Signed)
 Two weeks supply samples of Eliquis  5 mg twice daily was given to PT.

## 2024-01-22 NOTE — Patient Instructions (Signed)

## 2024-01-22 NOTE — Telephone Encounter (Signed)
 Medication name/dosage: Samples List: Eliquis  5 mg  Administration instructions: take one tablet by mouth twice daily   Reason for samples: insurance issues  Ordering provider: Berry Bristol  *Once above information entered, route the phone encounter to CV DIV MAG ST SAMPLES and send Teams message to team member assigned to Samples for the day.

## 2024-01-22 NOTE — Progress Notes (Signed)
 Cardiology Office Note:  .   Date:  01/22/2024  ID:  Nancy Sosa, DOB [birth year], MRN [id number] PCP: Pete Brand, DO  [facility] Providers Cardiologist:  Knox Perl, MD   History of Present Illness: .   Nancy Sosa is a 83 y.o. 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 Patient is here on a 1 year follow-up.  She has had normal LVEF by echocardiogram in 2016 with posteriorly directed MR with questionable mitral valve prolapse of anterior leaflet and Lexiscan nuclear stress test that revealed no evidence of ischemia however EF was 22% felt to be due to A-fib.  Discussed the use of AI scribe software for clinical note transcription with the patient, who gave verbal consent to proceed.  History of Present Illness Nancy Sosa is an 83 year old female with atrial fibrillation who presents for follow-up regarding her medication management. She is accompanied by her sister.  She is prescribed Eliquis  for atrial fibrillation but has been without it for a week due to insurance issues. Her blood pressure is well controlled with metoprolol  tartrate 100 mg twice daily and amlodipine  10 mg once daily. She discontinued spironolactone  due to adverse effects.  Labs    External Labs:  Labs 02/24/2023 from PCP on Care Everywhere:  TSH normal at 2.380.  Vitamin D43.4.  A1c 5.3%.  Serum glucose 82 mg, BUN 5, creatinine 0.56, EGFR 92 mL, LFTs normal.  Hb 15.2/HCT 47.7, platelets 200, normal indicis.  Total cholesterol 277, triglycerides 163, HDL 59, LDL 188.  ROS  Review of Systems  Cardiovascular:  Negative for chest pain, dyspnea on exertion and leg swelling.    Physical Exam:   VS:  BP 104/68 (BP  Location: Left Arm, Patient Position: Sitting, Cuff Size: Large)   Pulse 90   Resp 16   Ht 5' 5 (1.651 m)   Wt 195 lb 12.8 oz (88.8 kg)   SpO2 98%   BMI 32.58 kg/m    Wt Readings from Last 3 Encounters:  01/22/24 195 lb 12.8 oz (88.8 kg)  09/06/22 199 lb 9.6 oz (90.5 kg)  09/05/21 209 lb 3.2 oz (94.9 kg)    Physical Exam Constitutional:      Appearance: She is obese.  Neck:     Vascular: No carotid bruit or JVD.   Cardiovascular:     Rate and Rhythm: Normal rate. Rhythm irregular.     Pulses:          Femoral pulses are 1+ on the right side and 0 on the left side.      Popliteal pulses are 1+ on the right side and 0 on the left side.     Heart sounds: No murmur heard. Pulmonary:     Effort: Pulmonary effort is normal.     Breath sounds: Normal breath sounds.  Abdominal:     General: Bowel sounds are normal.     Palpations: Abdomen is soft.   Musculoskeletal:     Right lower leg: No edema.     Left lower leg: No edema.   Skin:    Capillary Refill: Capillary refill takes less than 2 seconds.    Studies Reviewed: Aaron Aas     EKG:    EKG Interpretation Date/Time:  Tuesday January 22 2024 14:07:09 EDT  Ventricular Rate:  82 PR Interval:    QRS Duration:  88 QT Interval:  352 QTC Calculation: 411 R Axis:   -32  Text Interpretation: EKG 01/22/2024: Atrial fibrillation with controlled ventricular response at the rate of 82 bpm, left anterior fascicular block.  Cannot exclude inferior infarct old.  Poor R wave progression probably related to anterior fascicular block.  Borderline low voltage complexes.  Compared to 09/06/2022, no significant change. Confirmed by Zeddie Njie, Jagadeesh [id number]) on 01/22/2024 2:23:43 PM  EKG 09/06/2022: Atrial fibrillation with controlled ventricular response at the rate of 74 bpm, anteroseptal infarct old. Nonspecific T abnormality.   Medications ordered    Meds ordered this encounter  Medications   apixaban  (ELIQUIS ) 5 MG TABS tablet    Sig: Take 1  tablet (5 mg total) by mouth 2 (two) times daily.    Dispense:  180 tablet    Refill:  0    Refills to Dr. Pete Brand,     ASSESSMENT AND PLAN: .      ICD-10-CM   1. Permanent atrial fibrillation (HCC)  I48.21 EKG 12-Lead    apixaban  (ELIQUIS ) 5 MG TABS tablet    2. Essential hypertension  I10      Click Here to Calculate/Change CHADS2VASc Score The patient's CHADS2-VASc score is 4, indicating a 4.8% annual risk of stroke.  Therefore, anticoagulation is recommended.   CHF History: No HTN History: Yes Diabetes History: No Stroke History: No Vascular Disease History: No  Assessment and Plan Assessment & Plan Atrial Fibrillation Chronic atrial fibrillation with a high risk of stroke, CHA2DS2S risk score of 4. Currently not on anticoagulation due to insurance issues with Eliquis  refill. EKG shows well-controlled atrial fibrillation with no symptoms, but high stroke risk necessitates anticoagulation. - Send new prescription for Eliquis  to pharmacy - Check for Eliquis  samples to provide - Instruct her to follow up with Dr. Pete Brand for continued management and prescription of Eliquis   Hypertension Hypertension is well-controlled with current medication regimen. 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 Post-surgery recovery is good with improved lung expansion and no current respiratory symptoms.  As she has remained stable from cardiac standpoint, heart rate is well-controlled on metoprolol , blood pressure is also well-controlled, I will see her back on a as needed basis, she can follow-up with her PCP for further management and evaluation. Signed,  Knox Perl, MD, [facility] 01/22/2024, 2:36 PM [facility] [address] [place], [place] [address] Phone: [phone]. Fax:   [phone]

## 2024-02-09 ENCOUNTER — Other Ambulatory Visit: Payer: Self-pay | Admitting: Cardiology

## 2024-02-09 DIAGNOSIS — I10 Essential (primary) hypertension: Secondary | ICD-10-CM

## 2024-02-15 DIAGNOSIS — I480 Paroxysmal atrial fibrillation: Secondary | ICD-10-CM | POA: Diagnosis not present

## 2024-02-15 DIAGNOSIS — M810 Age-related osteoporosis without current pathological fracture: Secondary | ICD-10-CM | POA: Diagnosis not present

## 2024-02-15 DIAGNOSIS — R7309 Other abnormal glucose: Secondary | ICD-10-CM | POA: Diagnosis not present

## 2024-02-15 DIAGNOSIS — Z7901 Long term (current) use of anticoagulants: Secondary | ICD-10-CM | POA: Diagnosis not present

## 2024-02-15 DIAGNOSIS — E785 Hyperlipidemia, unspecified: Secondary | ICD-10-CM | POA: Diagnosis not present

## 2024-02-15 DIAGNOSIS — E559 Vitamin D deficiency, unspecified: Secondary | ICD-10-CM | POA: Diagnosis not present

## 2024-02-21 DIAGNOSIS — Z7901 Long term (current) use of anticoagulants: Secondary | ICD-10-CM | POA: Diagnosis not present

## 2024-02-21 DIAGNOSIS — M3320 Polymyositis, organ involvement unspecified: Secondary | ICD-10-CM | POA: Diagnosis not present

## 2024-02-21 DIAGNOSIS — I480 Paroxysmal atrial fibrillation: Secondary | ICD-10-CM | POA: Diagnosis not present

## 2024-02-21 DIAGNOSIS — Z0000 Encounter for general adult medical examination without abnormal findings: Secondary | ICD-10-CM | POA: Diagnosis not present

## 2024-02-27 DIAGNOSIS — H401132 Primary open-angle glaucoma, bilateral, moderate stage: Secondary | ICD-10-CM | POA: Diagnosis not present

## 2024-02-27 DIAGNOSIS — Z961 Presence of intraocular lens: Secondary | ICD-10-CM | POA: Diagnosis not present

## 2024-03-20 DIAGNOSIS — M25562 Pain in left knee: Secondary | ICD-10-CM | POA: Diagnosis not present

## 2024-03-20 DIAGNOSIS — M1712 Unilateral primary osteoarthritis, left knee: Secondary | ICD-10-CM | POA: Diagnosis not present

## 2024-03-20 DIAGNOSIS — M6281 Muscle weakness (generalized): Secondary | ICD-10-CM | POA: Diagnosis not present

## 2024-03-20 DIAGNOSIS — M810 Age-related osteoporosis without current pathological fracture: Secondary | ICD-10-CM | POA: Diagnosis not present

## 2024-03-20 DIAGNOSIS — Z79899 Other long term (current) drug therapy: Secondary | ICD-10-CM | POA: Diagnosis not present

## 2024-03-20 DIAGNOSIS — M3320 Polymyositis, organ involvement unspecified: Secondary | ICD-10-CM | POA: Diagnosis not present

## 2024-04-24 DIAGNOSIS — Z79899 Other long term (current) drug therapy: Secondary | ICD-10-CM | POA: Diagnosis not present

## 2024-04-24 DIAGNOSIS — M3320 Polymyositis, organ involvement unspecified: Secondary | ICD-10-CM | POA: Diagnosis not present

## 2024-06-19 DIAGNOSIS — M25562 Pain in left knee: Secondary | ICD-10-CM | POA: Diagnosis not present

## 2024-06-19 DIAGNOSIS — M810 Age-related osteoporosis without current pathological fracture: Secondary | ICD-10-CM | POA: Diagnosis not present

## 2024-06-19 DIAGNOSIS — M3320 Polymyositis, organ involvement unspecified: Secondary | ICD-10-CM | POA: Diagnosis not present

## 2024-06-19 DIAGNOSIS — M1712 Unilateral primary osteoarthritis, left knee: Secondary | ICD-10-CM | POA: Diagnosis not present

## 2024-06-19 DIAGNOSIS — Z79899 Other long term (current) drug therapy: Secondary | ICD-10-CM | POA: Diagnosis not present

## 2024-06-19 DIAGNOSIS — M6281 Muscle weakness (generalized): Secondary | ICD-10-CM | POA: Diagnosis not present

## 2024-09-01 ENCOUNTER — Other Ambulatory Visit: Payer: Self-pay | Admitting: Cardiology

## 2024-09-01 DIAGNOSIS — I4821 Permanent atrial fibrillation: Secondary | ICD-10-CM
